# Patient Record
Sex: Female | Born: 1975 | Hispanic: Yes | Marital: Married | Smoking: Never smoker
Health system: Southern US, Community
[De-identification: ages and names within clinical notes are randomized; demographics above are authoritative.]

## PROBLEM LIST (undated history)

## (undated) ENCOUNTER — Inpatient Hospital Stay (HOSPITAL_COMMUNITY): Payer: Self-pay

## (undated) DIAGNOSIS — E162 Hypoglycemia, unspecified: Secondary | ICD-10-CM

## (undated) DIAGNOSIS — T7840XA Allergy, unspecified, initial encounter: Secondary | ICD-10-CM

## (undated) DIAGNOSIS — D649 Anemia, unspecified: Secondary | ICD-10-CM

## (undated) DIAGNOSIS — Z531 Procedure and treatment not carried out because of patient's decision for reasons of belief and group pressure: Secondary | ICD-10-CM

## (undated) DIAGNOSIS — IMO0001 Reserved for inherently not codable concepts without codable children: Secondary | ICD-10-CM

## (undated) DIAGNOSIS — N390 Urinary tract infection, site not specified: Secondary | ICD-10-CM

## (undated) DIAGNOSIS — K8061 Calculus of gallbladder and bile duct with cholecystitis, unspecified, with obstruction: Secondary | ICD-10-CM

## (undated) DIAGNOSIS — Z9889 Other specified postprocedural states: Secondary | ICD-10-CM

## (undated) DIAGNOSIS — Z9884 Bariatric surgery status: Secondary | ICD-10-CM

## (undated) DIAGNOSIS — M797 Fibromyalgia: Secondary | ICD-10-CM

## (undated) HISTORY — DX: Allergy, unspecified, initial encounter: T78.40XA

## (undated) HISTORY — PX: LASIK: SHX215

## (undated) HISTORY — DX: Anemia, unspecified: D64.9

## (undated) HISTORY — DX: Hypoglycemia, unspecified: E16.2

---

## 2002-04-19 DIAGNOSIS — Z9889 Other specified postprocedural states: Secondary | ICD-10-CM

## 2002-04-19 HISTORY — DX: Other specified postprocedural states: Z98.890

## 2010-04-19 DIAGNOSIS — Z9884 Bariatric surgery status: Secondary | ICD-10-CM

## 2010-04-19 HISTORY — DX: Bariatric surgery status: Z98.84

## 2010-05-09 HISTORY — PX: LAPAROSCOPIC GASTRIC BYPASS: SUR771

## 2011-07-21 ENCOUNTER — Telehealth (INDEPENDENT_AMBULATORY_CARE_PROVIDER_SITE_OTHER): Payer: Self-pay

## 2011-07-21 NOTE — Telephone Encounter (Signed)
Pt has travel plans at end of month and requests sooner appt to eval her GB. I advised pt that since her tests were done at [facility] we need to get those records for Dr Andrey Campanile to review. Pt can be reached a [phone].

## 2011-07-21 NOTE — Telephone Encounter (Signed)
Reminder placed if any cancellations open to move patient up.

## 2011-07-22 ENCOUNTER — Telehealth (INDEPENDENT_AMBULATORY_CARE_PROVIDER_SITE_OTHER): Payer: Self-pay | Admitting: General Surgery

## 2011-07-22 NOTE — Telephone Encounter (Signed)
Pt calling for Leslie Bray; informed she is off today.  She is calling to request appt with Dr. Andrey Campanile be moved up from 4/22, as she is travelling out of the country.

## 2011-07-23 NOTE — Telephone Encounter (Signed)
Already placed reminder if something cancels to move patient sooner, but if nothing opens up, there is nothing I can do.

## 2011-07-30 ENCOUNTER — Encounter (INDEPENDENT_AMBULATORY_CARE_PROVIDER_SITE_OTHER): Payer: Self-pay | Admitting: General Surgery

## 2011-07-30 ENCOUNTER — Ambulatory Visit (INDEPENDENT_AMBULATORY_CARE_PROVIDER_SITE_OTHER): Payer: Commercial Managed Care - PPO | Admitting: General Surgery

## 2011-07-30 VITALS — BP 108/70 | HR 64 | Temp 99.4°F | Resp 12 | Ht 66.0 in | Wt 148.4 lb

## 2011-07-30 DIAGNOSIS — Z9884 Bariatric surgery status: Secondary | ICD-10-CM | POA: Insufficient documentation

## 2011-07-30 DIAGNOSIS — K811 Chronic cholecystitis: Secondary | ICD-10-CM | POA: Insufficient documentation

## 2011-07-30 NOTE — Patient Instructions (Signed)
Try to obtain your operative note from [place].

## 2011-07-30 NOTE — Progress Notes (Signed)
Patient ID: Leslie Bray, female   DOB: [birth year], 36 y.o.   MRN: [id number]  Chief Complaint  Patient presents with  . Abdominal Pain    new pt- eval GB     HPI Leslie Bray is a 36 y.o. female.   HPI [name] is referred by Dr. Quentin Angst for evaluation for gallbladder removal. The patient underwent a laparoscopic Roux-en-Y gastric bypass in [place] January 2012. She has lost a total of 70 pounds since surgery. She states that she's been having abdominal problems for about one year. Initially it was very intermittent. It started as upper back pain in the midline and she described it as a mild discomfort. He would last for 2-3 days. She states that she was diagnosed with fibromyalgia. She states she had an ultrasound done last year which demonstrated reportedly sludge in her gallbladder. However over the past several months she has been having more frequent episodes of discomfort. She states that this is different than dumping. She has had one to 2 episodes of dumping syndrome and this is completely different. She now has episodes that occur every week that lasts for 2-3 days at a time. The discomfort starts in her epigastrium and on her left side and radiates to her back. She describes it as almost like a band across her upper abdomen. She denies any vomiting. She denies any fevers or chills. She denies any melena, hematochezia, constipation, diarrhea, acholic stools. She had a severe episode of chest discomfort this past Saturday which prompted her to go to the emergency room at [facility]. She states that an ultrasound and lab work was performed. She states that since there was no evidence of infection she was sent home. She denies taking any NSAID. She is not a smoker. She denies any jaundice. Past Medical History  Diagnosis Date  . Vitamin d deficiency   . Allergy   . Low blood sugar     Past Surgical History  Procedure Date  . Lasik 2004, 2010   bilateral  . Laparoscopic gastric bypass 05/09/10    [place]    Family History  Problem Relation Age of Onset  . Hyperlipidemia Mother     Social History History  Substance Use Topics  . Smoking status: Never Smoker   . Smokeless tobacco: Not on file  . Alcohol Use: Yes    No Known Allergies  Current Outpatient Prescriptions  Medication Sig Dispense Refill  . Cetirizine HCl (ZYRTEC PO) Take by mouth daily.      . ergocalciferol (VITAMIN D2) 50000 UNITS capsule Take 50,000 Units by mouth once a week.      . fluticasone (FLONASE) 50 MCG/ACT nasal spray Place 2 sprays into the nose as needed.      . Pregabalin (LYRICA PO) Take by mouth daily.        Review of Systems Review of Systems  Constitutional: Negative for fever, chills, activity change and appetite change.  HENT: Negative for congestion, neck pain and neck stiffness.   Eyes: Negative for photophobia and visual disturbance.  Respiratory: Negative for chest tightness and shortness of breath.   Cardiovascular: Negative for chest pain and palpitations.  Gastrointestinal:       See hpi  Genitourinary: Negative for dysuria, difficulty urinating and menstrual problem.  Musculoskeletal: Negative for joint swelling.  Skin: Negative for pallor, rash and wound.  Neurological: Negative for tremors, seizures, facial asymmetry, weakness and numbness.  Hematological: Negative for adenopathy. Does not bruise/bleed  easily.  Psychiatric/Behavioral: Negative for suicidal ideas and behavioral problems.    Blood pressure 108/70, pulse 64, temperature 99.4 F (37.4 C), temperature source Temporal, resp. rate 12, height 5\' 6"  (1.676 m), weight 148 lb 6.4 oz (67.314 kg).  Physical Exam Physical Exam  Vitals reviewed. Constitutional: She is oriented to person, place, and time. She appears well-developed and well-nourished. No distress.  HENT:  Head: Normocephalic and atraumatic.  Right Ear: External ear normal.  Left Ear: External  ear normal.  Nose: Nose normal.  Eyes: Conjunctivae are normal. No scleral icterus.  Neck: Normal range of motion. Neck supple. No tracheal deviation present. No thyromegaly present.  Cardiovascular: Normal rate, regular rhythm and normal heart sounds.   Pulmonary/Chest: Effort normal and breath sounds normal. No respiratory distress. She has no wheezes.  Abdominal: Soft. Bowel sounds are normal. She exhibits no distension. There is no tenderness. There is no rebound, no guarding and negative Murphy's sign.    Musculoskeletal: Normal range of motion. She exhibits no edema and no tenderness.  Lymphadenopathy:    She has no cervical adenopathy.  Neurological: She is alert and oriented to person, place, and time. She exhibits normal muscle tone.  Skin: Skin is warm and dry. No rash noted. She is not diaphoretic. No erythema.  Psychiatric: She has a normal mood and affect. Her behavior is normal. Thought content normal.    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 Less likely diagnoses include marginal ulcer, internal hernia.   We have requested her ER records from [facility].  She will attempt to get her operative note from [place] regarding her bypass surgery.    We discussed gallbladder disease. The patient was given Agricultural engineer. We discussed non-operative and operative management. I explained that after gastric bypass surgery and with rapid weight loss, it increases the risks of developing gallstones.   I discussed laparoscopic cholecystectomy with IOC in detail.  The patient was given educational material as well as diagrams  detailing the procedure.  We discussed the risks and benefits of a laparoscopic cholecystectomy including, but not limited to bleeding, infection, injury to surrounding structures such as the intestine or liver, bile leak, retained gallstones, need to convert to an open procedure, prolonged diarrhea, blood clots such as  DVT, common bile duct injury, anesthesia risks, failure to ameliorate her symptoms, and possible need for additional procedures.  We discussed the typical post-operative recovery course. I explained that the likelihood of improvement of their symptoms is 70-80%.  She has elected to proceed to the operating room. We will schedule her for surgery.   Mary Sella. Andrey Campanile, MD, FACS General, Bariatric, & Minimally Invasive Surgery [facility], [place]        [facility] M 07/30/2011, 6:01 PM

## 2011-08-05 ENCOUNTER — Telehealth (INDEPENDENT_AMBULATORY_CARE_PROVIDER_SITE_OTHER): Payer: Self-pay | Admitting: General Surgery

## 2011-08-05 NOTE — Telephone Encounter (Signed)
Message copied by Liliana Cline on Thu Aug 05, 2011  3:24 PM ------      Message from: [place], [place]      Created: Fri Jul 30, 2011  5:02 PM       Call and let her know if she has gallstones- [facility] records

## 2011-08-05 NOTE — Telephone Encounter (Signed)
Patient aware just received records from [facility]. No gallstones found, just sludge.

## 2011-08-09 ENCOUNTER — Ambulatory Visit (INDEPENDENT_AMBULATORY_CARE_PROVIDER_SITE_OTHER): Payer: Self-pay | Admitting: General Surgery

## 2011-08-11 ENCOUNTER — Other Ambulatory Visit (HOSPITAL_COMMUNITY): Payer: Self-pay

## 2011-08-18 DIAGNOSIS — K8061 Calculus of gallbladder and bile duct with cholecystitis, unspecified, with obstruction: Secondary | ICD-10-CM

## 2011-08-18 HISTORY — DX: Calculus of gallbladder and bile duct with cholecystitis, unspecified, with obstruction: K80.61

## 2011-08-23 ENCOUNTER — Encounter (INDEPENDENT_AMBULATORY_CARE_PROVIDER_SITE_OTHER): Payer: Self-pay

## 2011-09-03 ENCOUNTER — Encounter (HOSPITAL_COMMUNITY)
Admission: RE | Admit: 2011-09-03 | Discharge: 2011-09-03 | Disposition: A | Discharge status: Home / Self Care | Payer: Commercial Managed Care - PPO | Source: Ambulatory Visit | Attending: General Surgery | Admitting: General Surgery

## 2011-09-03 ENCOUNTER — Other Ambulatory Visit (HOSPITAL_COMMUNITY): Payer: Self-pay

## 2011-09-03 ENCOUNTER — Encounter (HOSPITAL_COMMUNITY): Payer: Self-pay | Admitting: Pharmacy Technician

## 2011-09-03 ENCOUNTER — Encounter (HOSPITAL_COMMUNITY): Payer: Self-pay

## 2011-09-03 HISTORY — DX: Fibromyalgia: M79.7

## 2011-09-03 HISTORY — DX: Urinary tract infection, site not specified: N39.0

## 2011-09-03 LAB — SURGICAL PCR SCREEN
MRSA, PCR: NEGATIVE
Staphylococcus aureus: NEGATIVE

## 2011-09-03 LAB — COMPREHENSIVE METABOLIC PANEL
ALT: 95 U/L — ABNORMAL HIGH (ref 0–35)
AST: 88 U/L — ABNORMAL HIGH (ref 0–37)
Albumin: 3.7 g/dL (ref 3.5–5.2)
Alkaline Phosphatase: 68 U/L (ref 39–117)
BUN: 10 mg/dL (ref 6–23)
CO2: 27 mEq/L (ref 19–32)
Calcium: 9.2 mg/dL (ref 8.4–10.5)
Chloride: 105 mEq/L (ref 96–112)
Creatinine, Ser: 0.72 mg/dL (ref 0.50–1.10)
GFR calc Af Amer: >90 mL/min (ref >90)
GFR calc non Af Amer: >90 mL/min (ref >90)
Glucose, Bld: 77 mg/dL (ref 70–99)
Potassium: 3.9 mEq/L (ref 3.5–5.1)
Sodium: 140 mEq/L (ref 135–145)
Total Bilirubin: 0.2 mg/dL — ABNORMAL LOW (ref 0.3–1.2)
Total Protein: 6.9 g/dL (ref 6.0–8.3)

## 2011-09-03 LAB — CBC
HCT: 35.1 % — ABNORMAL LOW (ref 36.0–46.0)
Hemoglobin: 11.6 g/dL — ABNORMAL LOW (ref 12.0–15.0)
MCH: 28.8 pg (ref 26.0–34.0)
MCHC: 33 g/dL (ref 30.0–36.0)
MCV: 87.1 fL (ref 78.0–100.0)
Platelets: 239 10*3/uL (ref 150–400)
RBC: 4.03 MIL/uL (ref 3.87–5.11)
RDW: 12.8 % (ref 11.5–15.5)
WBC: 6.5 10*3/uL (ref 4.0–10.5)

## 2011-09-03 LAB — HCG, SERUM, QUALITATIVE: Preg, Serum: NEGATIVE

## 2011-09-03 LAB — NO BLOOD PRODUCTS

## 2011-09-03 LAB — DIFFERENTIAL
Basophils Absolute: 0 10*3/uL (ref 0.0–0.1)
Basophils Relative: 1 % (ref 0–1)
Eosinophils Absolute: 0.3 10*3/uL (ref 0.0–0.7)
Eosinophils Relative: 4 % (ref 0–5)
Lymphocytes Relative: 31 % (ref 12–46)
Lymphs Abs: 2 10*3/uL (ref 0.7–4.0)
Monocytes Absolute: 0.7 10*3/uL (ref 0.1–1.0)
Monocytes Relative: 11 % (ref 3–12)
Neutro Abs: 3.5 10*3/uL (ref 1.7–7.7)
Neutrophils Relative %: 54 % (ref 43–77)

## 2011-09-03 MED ORDER — CHLORHEXIDINE GLUCONATE 4 % EX LIQD: 1.0000 "application " | Freq: Once | CUTANEOUS | Status: DC

## 2011-09-03 NOTE — Pre-Procedure Instructions (Addendum)
[address] Leslie Bray  09/03/2011   Your procedure is scheduled on: 09/10/11  Report to [facility] at 1130 AM.  Call this number if you have problems the morning of surgery: [phone]   Remember:   Do not eat food:After Midnight.  May have clear liquids: up to 4 Hours before arrival. 730 am  Clear liquids include soda, tea, black coffee, apple or grape juice, broth.  Take these medicines the morning of surgery with A SIP OF WATER: lyrica, zyrtec, flonase   Do not wear jewelry, make-up or nail polish.  Do not wear lotions, powders, or perfumes. You may wear deodorant.  Do not shave 48 hours prior to surgery. Men may shave face and neck.  Do not bring valuables to the hospital.  Contacts, dentures or bridgework may not be worn into surgery.  Leave suitcase in the car. After surgery it may be brought to your room.  For patients admitted to the hospital, checkout time is 11:00 AM the day of discharge.   Patients discharged the day of surgery will not be allowed to drive home.  Name and phone number of your driver:[name] [phone]  Special Instructions: CHG Shower Use Special Wash: 1/2 bottle night before surgery and 1/2 bottle morning of surgery.   Please read over the following fact sheets that you were given: Pain Booklet, Coughing and Deep Breathing, MRSA Information and Surgical Site Infection Prevention

## 2011-09-03 NOTE — Progress Notes (Signed)
Patient is jehovah's witness   No blood or blood products

## 2011-09-06 NOTE — Consult Note (Addendum)
Anesthesia Chart Review:  Patient is a 36 year old female scheduled for laparoscopic cholecystectomy on 09/10/11 for chronic cholecystitis.  By notes she had a recent (07/24/11) ultrasound that showed gallbladder sludge but otherwise WNL.  History includes Roux-en-Y gastric bypass in [place] in January 2012 with 70 lb weight loss since, Vit D deficiency, hypoglycemia, fibromyalgia, non-smoker.  Labs noted.  H/H 11.6/35.1.  She has refused all blood products (Jehovah's Witness).  AST/ALT are elevated at 88/95.  This may be related to her cholecystitis.  Currently there are no comparison labs.  She was seen at [facility] in April 2013.  I have requested that they send comparison labs if available.  Will follow-up when additional records are received.  Addendum:  09/07/11 1045  I have yet to receive records from [facility], but was able to call her PCP (Dr. Quentin Angst) office at [facility].  They had a CMET from 07/15/11 that showed an AST of 43 and AST of 44.  Since she has had further elevation in her transaminases I will repeat HFP and get a PT/PTT on the day of surgery to ensure stability.  Shonna Chock, PA-C

## 2011-09-09 MED ORDER — DEXTROSE 5 % IV SOLN
2.0000 g | INTRAVENOUS | Status: AC
  Administered 2011-09-10: 2 g via INTRAVENOUS
  Filled 2011-09-09: qty 2

## 2011-09-10 ENCOUNTER — Ambulatory Visit (HOSPITAL_COMMUNITY): Payer: Commercial Managed Care - PPO

## 2011-09-10 ENCOUNTER — Encounter (HOSPITAL_COMMUNITY)
Admission: RE | Disposition: A | Discharge status: Home / Self Care | Payer: Self-pay | Source: Ambulatory Visit | Attending: General Surgery

## 2011-09-10 ENCOUNTER — Encounter (HOSPITAL_COMMUNITY): Payer: Self-pay | Admitting: *Deleted

## 2011-09-10 ENCOUNTER — Encounter (HOSPITAL_COMMUNITY): Payer: Self-pay | Admitting: Vascular Surgery

## 2011-09-10 ENCOUNTER — Encounter (HOSPITAL_COMMUNITY): Payer: Self-pay | Admitting: General Practice

## 2011-09-10 ENCOUNTER — Ambulatory Visit (HOSPITAL_COMMUNITY): Payer: Commercial Managed Care - PPO | Admitting: Vascular Surgery

## 2011-09-10 ENCOUNTER — Observation Stay (HOSPITAL_COMMUNITY)
Admission: RE | Admit: 2011-09-10 | Discharge: 2011-09-11 | Disposition: A | Discharge status: Home / Self Care | Payer: Commercial Managed Care - PPO | Source: Ambulatory Visit | Attending: General Surgery | Admitting: General Surgery

## 2011-09-10 DIAGNOSIS — Z01812 Encounter for preprocedural laboratory examination: Secondary | ICD-10-CM | POA: Insufficient documentation

## 2011-09-10 DIAGNOSIS — IMO0001 Reserved for inherently not codable concepts without codable children: Secondary | ICD-10-CM | POA: Insufficient documentation

## 2011-09-10 DIAGNOSIS — K811 Chronic cholecystitis: Secondary | ICD-10-CM

## 2011-09-10 DIAGNOSIS — K8010 Calculus of gallbladder with chronic cholecystitis without obstruction: Secondary | ICD-10-CM

## 2011-09-10 DIAGNOSIS — Z531 Procedure and treatment not carried out because of patient's decision for reasons of belief and group pressure: Secondary | ICD-10-CM

## 2011-09-10 HISTORY — PX: CHOLECYSTECTOMY: SHX55

## 2011-09-10 HISTORY — DX: Procedure and treatment not carried out because of patient's decision for reasons of belief and group pressure: Z53.1

## 2011-09-10 HISTORY — DX: Reserved for inherently not codable concepts without codable children: IMO0001

## 2011-09-10 LAB — HEPATIC FUNCTION PANEL
ALT: 74 U/L — ABNORMAL HIGH (ref 0–35)
AST: 57 U/L — ABNORMAL HIGH (ref 0–37)
Albumin: 4.2 g/dL (ref 3.5–5.2)
Alkaline Phosphatase: 66 U/L (ref 39–117)
Bilirubin, Direct: <0.1 mg/dL (ref 0.0–0.3)
Total Bilirubin: 0.4 mg/dL (ref 0.3–1.2)
Total Protein: 7.5 g/dL (ref 6.0–8.3)

## 2011-09-10 LAB — APTT: aPTT: 30 seconds (ref 24–37)

## 2011-09-10 LAB — PROTIME-INR
INR: 1.07 (ref 0.00–1.49)
Prothrombin Time: 14.1 seconds (ref 11.6–15.2)

## 2011-09-10 SURGERY — LAPAROSCOPIC CHOLECYSTECTOMY WITH INTRAOPERATIVE CHOLANGIOGRAM
Anesthesia: General | Site: Abdomen | Wound class: Clean Contaminated

## 2011-09-10 MED ORDER — LACTATED RINGERS IV SOLN: INTRAVENOUS | Status: DC

## 2011-09-10 MED ORDER — ROCURONIUM BROMIDE 100 MG/10ML IV SOLN
INTRAVENOUS | Status: DC | PRN
  Administered 2011-09-10: 40 mg via INTRAVENOUS
  Administered 2011-09-10: 20 mg via INTRAVENOUS

## 2011-09-10 MED ORDER — BUPIVACAINE-EPINEPHRINE 0.25% -1:200000 IJ SOLN
INTRAMUSCULAR | Status: DC | PRN
  Administered 2011-09-10: 11 mL

## 2011-09-10 MED ORDER — PROPOFOL 10 MG/ML IV EMUL
INTRAVENOUS | Status: DC | PRN
  Administered 2011-09-10: 150 mg via INTRAVENOUS

## 2011-09-10 MED ORDER — FENTANYL CITRATE 0.05 MG/ML IJ SOLN
INTRAMUSCULAR | Status: DC | PRN
  Administered 2011-09-10: 100 ug via INTRAVENOUS
  Administered 2011-09-10: 50 ug via INTRAVENOUS
  Administered 2011-09-10: 100 ug via INTRAVENOUS
  Administered 2011-09-10 (×2): 50 ug via INTRAVENOUS

## 2011-09-10 MED ORDER — MORPHINE SULFATE 2 MG/ML IJ SOLN: 1.0000 mg | INTRAMUSCULAR | Status: DC | PRN

## 2011-09-10 MED ORDER — ADULT MULTIVITAMIN W/MINERALS CH
1.0000 | ORAL_TABLET | Freq: Every day | ORAL | Status: DC
  Administered 2011-09-10: 1 via ORAL
  Filled 2011-09-10 (×2): qty 1

## 2011-09-10 MED ORDER — OXYCODONE-ACETAMINOPHEN 5-325 MG PO TABS
1.0000 | ORAL_TABLET | ORAL | Status: DC | PRN
  Administered 2011-09-10 – 2011-09-11 (×2): 2 via ORAL
  Filled 2011-09-10 (×2): qty 2

## 2011-09-10 MED ORDER — SODIUM CHLORIDE 0.9 % IV SOLN
INTRAVENOUS | Status: DC | PRN
  Administered 2011-09-10: 14:00:00

## 2011-09-10 MED ORDER — OXYCODONE-ACETAMINOPHEN 5-325 MG PO TABS: 1.0000 | ORAL_TABLET | ORAL | Status: AC | PRN

## 2011-09-10 MED ORDER — 0.9 % SODIUM CHLORIDE (POUR BTL) OPTIME
TOPICAL | Status: DC | PRN
  Administered 2011-09-10: 1000 mL

## 2011-09-10 MED ORDER — SODIUM CHLORIDE 0.9 % IR SOLN
Status: DC | PRN
  Administered 2011-09-10: 1000 mL

## 2011-09-10 MED ORDER — LIDOCAINE HCL (CARDIAC) 20 MG/ML IV SOLN
INTRAVENOUS | Status: DC | PRN
  Administered 2011-09-10: 60 mg via INTRAVENOUS

## 2011-09-10 MED ORDER — MIDAZOLAM HCL 5 MG/5ML IJ SOLN
INTRAMUSCULAR | Status: DC | PRN
  Administered 2011-09-10: 2 mg via INTRAVENOUS

## 2011-09-10 MED ORDER — ONDANSETRON HCL 4 MG/2ML IJ SOLN
INTRAMUSCULAR | Status: DC | PRN
  Administered 2011-09-10: 4 mg via INTRAVENOUS

## 2011-09-10 MED ORDER — ENOXAPARIN SODIUM 40 MG/0.4ML ~~LOC~~ SOLN
40.0000 mg | SUBCUTANEOUS | Status: DC
  Administered 2011-09-11: 40 mg via SUBCUTANEOUS
  Filled 2011-09-10 (×2): qty 0.4

## 2011-09-10 MED ORDER — KCL IN DEXTROSE-NACL 20-5-0.45 MEQ/L-%-% IV SOLN
INTRAVENOUS | Status: DC
  Administered 2011-09-10 – 2011-09-11 (×2): via INTRAVENOUS
  Filled 2011-09-10 (×3): qty 1000

## 2011-09-10 MED ORDER — HYDROMORPHONE HCL PF 1 MG/ML IJ SOLN
0.2500 mg | INTRAMUSCULAR | Status: DC | PRN
  Administered 2011-09-10 (×2): 0.5 mg via INTRAVENOUS

## 2011-09-10 MED ORDER — ONDANSETRON HCL 4 MG/2ML IJ SOLN: 4.0000 mg | Freq: Four times a day (QID) | INTRAMUSCULAR | Status: DC | PRN

## 2011-09-10 MED ORDER — ONDANSETRON HCL 4 MG PO TABS: 4.0000 mg | ORAL_TABLET | Freq: Four times a day (QID) | ORAL | Status: DC | PRN

## 2011-09-10 MED ORDER — NEOSTIGMINE METHYLSULFATE 1 MG/ML IJ SOLN
INTRAMUSCULAR | Status: DC | PRN
  Administered 2011-09-10: 3 mg via INTRAVENOUS

## 2011-09-10 MED ORDER — GLYCOPYRROLATE 0.2 MG/ML IJ SOLN
INTRAMUSCULAR | Status: DC | PRN
  Administered 2011-09-10: .5 mg via INTRAVENOUS

## 2011-09-10 MED ORDER — PREGABALIN 50 MG PO CAPS
75.0000 mg | ORAL_CAPSULE | Freq: Every day | ORAL | Status: DC
  Filled 2011-09-10: qty 3

## 2011-09-10 MED ORDER — LACTATED RINGERS IV SOLN
INTRAVENOUS | Status: DC | PRN
  Administered 2011-09-10: 13:00:00 via INTRAVENOUS

## 2011-09-10 MED ORDER — CYCLOBENZAPRINE HCL 10 MG PO TABS: 10.0000 mg | ORAL_TABLET | Freq: Three times a day (TID) | ORAL | Status: DC | PRN

## 2011-09-10 MED ORDER — DROPERIDOL 2.5 MG/ML IJ SOLN: 0.6250 mg | INTRAMUSCULAR | Status: DC | PRN

## 2011-09-10 SURGICAL SUPPLY — 51 items
APPLIER CLIP 5 13 M/L LIGAMAX5 (MISCELLANEOUS) ×2
BANDAGE ADHESIVE 1X3 (GAUZE/BANDAGES/DRESSINGS) ×6 IMPLANT
BENZOIN TINCTURE PRP APPL 2/3 (GAUZE/BANDAGES/DRESSINGS) ×2 IMPLANT
BLADE SURG ROTATE 9660 (MISCELLANEOUS) ×2 IMPLANT
CANISTER SUCTION 2500CC (MISCELLANEOUS) ×2 IMPLANT
CHLORAPREP W/TINT 26ML (MISCELLANEOUS) ×2 IMPLANT
CLIP APPLIE 5 13 M/L LIGAMAX5 (MISCELLANEOUS) ×1 IMPLANT
CLOTH BEACON ORANGE TIMEOUT ST (SAFETY) ×2 IMPLANT
COVER MAYO STAND STRL (DRAPES) ×2 IMPLANT
COVER SURGICAL LIGHT HANDLE (MISCELLANEOUS) ×2 IMPLANT
DECANTER SPIKE VIAL GLASS SM (MISCELLANEOUS) IMPLANT
DRAPE C-ARM 42X72 X-RAY (DRAPES) ×2 IMPLANT
DRAPE CAMERA CLOSED 9X96 (DRAPES) ×2 IMPLANT
DRAPE UTILITY 15X26 W/TAPE STR (DRAPE) ×4 IMPLANT
DRSG TEGADERM 4X4.75 (GAUZE/BANDAGES/DRESSINGS) ×2 IMPLANT
ELECT REM PT RETURN 9FT ADLT (ELECTROSURGICAL) ×2
ELECTRODE REM PT RTRN 9FT ADLT (ELECTROSURGICAL) ×1 IMPLANT
ENDOLOOP SUT PDS II  0 18 (SUTURE) ×1
ENDOLOOP SUT PDS II 0 18 (SUTURE) ×1 IMPLANT
GAUZE SPONGE 2X2 8PLY STRL LF (GAUZE/BANDAGES/DRESSINGS) ×1 IMPLANT
GLOVE BIOGEL M STRL SZ7.5 (GLOVE) ×2 IMPLANT
GLOVE BIOGEL PI IND STRL 6 (GLOVE) ×1 IMPLANT
GLOVE BIOGEL PI IND STRL 6.5 (GLOVE) ×1 IMPLANT
GLOVE BIOGEL PI IND STRL 7.0 (GLOVE) ×1 IMPLANT
GLOVE BIOGEL PI IND STRL 8 (GLOVE) ×1 IMPLANT
GLOVE BIOGEL PI INDICATOR 6 (GLOVE) ×1
GLOVE BIOGEL PI INDICATOR 6.5 (GLOVE) ×1
GLOVE BIOGEL PI INDICATOR 7.0 (GLOVE) ×1
GLOVE BIOGEL PI INDICATOR 8 (GLOVE) ×1
GLOVE ECLIPSE 6.5 STRL STRAW (GLOVE) ×2 IMPLANT
GOWN STRL NON-REIN LRG LVL3 (GOWN DISPOSABLE) IMPLANT
GOWN STRL REIN XL XLG (GOWN DISPOSABLE) IMPLANT
KIT BASIN OR (CUSTOM PROCEDURE TRAY) ×2 IMPLANT
KIT ROOM TURNOVER OR (KITS) ×2 IMPLANT
NS IRRIG 1000ML POUR BTL (IV SOLUTION) ×2 IMPLANT
PAD ARMBOARD 7.5X6 YLW CONV (MISCELLANEOUS) ×4 IMPLANT
POUCH SPECIMEN RETRIEVAL 10MM (ENDOMECHANICALS) ×2 IMPLANT
SCISSORS LAP 5X35 DISP (ENDOMECHANICALS) IMPLANT
SET CHOLANGIOGRAPH 5 50 .035 (SET/KITS/TRAYS/PACK) ×2 IMPLANT
SET IRRIG TUBING LAPAROSCOPIC (IRRIGATION / IRRIGATOR) ×2 IMPLANT
SLEEVE ENDOPATH XCEL 5M (ENDOMECHANICALS) ×4 IMPLANT
SPECIMEN JAR SMALL (MISCELLANEOUS) ×2 IMPLANT
SPONGE GAUZE 2X2 STER 10/PKG (GAUZE/BANDAGES/DRESSINGS) ×1
SUT MNCRL AB 4-0 PS2 18 (SUTURE) ×2 IMPLANT
SUT VICRYL 0 UR6 27IN ABS (SUTURE) ×2 IMPLANT
TOWEL OR 17X24 6PK STRL BLUE (TOWEL DISPOSABLE) IMPLANT
TOWEL OR 17X26 10 PK STRL BLUE (TOWEL DISPOSABLE) IMPLANT
TRAY LAPAROSCOPIC (CUSTOM PROCEDURE TRAY) ×2 IMPLANT
TROCAR XCEL BLUNT TIP 100MML (ENDOMECHANICALS) ×2 IMPLANT
TROCAR XCEL NON-BLD 5MMX100MML (ENDOMECHANICALS) ×2 IMPLANT
WATER STERILE IRR 1000ML POUR (IV SOLUTION) IMPLANT

## 2011-09-10 NOTE — Discharge Instructions (Signed)
[facility] LAPAROSCOPIC SURGERY: POST OP INSTRUCTIONS Always review your discharge instruction sheet given to you by the facility where your surgery was performed. IF YOU HAVE DISABILITY OR FAMILY LEAVE FORMS, YOU MUST BRING THEM TO THE OFFICE FOR PROCESSING.   DO NOT GIVE THEM TO YOUR DOCTOR.  1. A prescription for pain medication may be given to you upon discharge.  Take your pain medication as prescribed, if needed.  If narcotic pain medicine is not needed, then you may take acetaminophen (Tylenol) or ibuprofen (Advil) as needed. 2. Take your usually prescribed medications unless otherwise directed. 3. If you need a refill on your pain medication, please contact your pharmacy.  They will contact our office to request authorization. Prescriptions will not be filled after 5pm or on week-ends. 4. You should follow a light diet the first few days after arrival home, such as soup and crackers, etc.  Be sure to include lots of fluids daily. 5. Most patients will experience some swelling and bruising in the area of the incisions.  Ice packs will help.  Swelling and bruising can take several days to resolve.  6. It is common to experience some constipation if taking pain medication after surgery.  Increasing fluid intake and taking a stool softener (such as Colace) will usually help or prevent this problem from occurring.  A mild laxative (Milk of Magnesia or Miralax) should be taken according to package instructions if there are no bowel movements after 48 hours. 7. Unless discharge instructions indicate otherwise, you may remove your bandages 24 hours after surgery, and you may shower at that time.    If your surgeon used skin glue on the incision, you may shower in 24 hours.  The glue will flake off over the next 2-3 weeks.  Any sutures or staples will be removed at the office during your follow-up visit. 8. ACTIVITIES:  You may resume regular (light) daily activities beginning the  next day--such as daily self-care, walking, climbing stairs--gradually increasing activities as tolerated.  You may have sexual intercourse when it is comfortable.  Refrain from any heavy lifting or straining until approved by your doctor. a. You may drive when you are no longer taking prescription pain medication, you can comfortably wear a seatbelt, and you can safely maneuver your car and apply brakes. 9. You should see your doctor in the office for a follow-up appointment approximately 2-3 weeks after your surgery.  Make sure that you call for this appointment within a day or two after you arrive home to insure a convenient appointment time. 10. OTHER INSTRUCTIONS:  WHEN TO CALL YOUR DOCTOR: 1. Fever over 101.0 2. Inability to urinate 3. Continued bleeding from incision. 4. Increased pain, redness, or drainage from the incision. 5. Increasing abdominal pain  The clinic staff is available to answer your questions during regular business hours.  Please don't hesitate to call and ask to speak to one of the nurses for clinical concerns.  If you have a medical emergency, go to the nearest emergency room or call 911.  A surgeon from [facility] is always on call at the hospital. [address], [place], [place]  [address] ? [address] [address], [place], [place]   [address] [phone] ? [phone] ? FAX [phone] Web site: [email or web address]

## 2011-09-10 NOTE — Anesthesia Postprocedure Evaluation (Signed)
  Anesthesia Post-op Note  Patient: Leslie Bray  Procedure(s) Performed: Procedure(s) (LRB): LAPAROSCOPIC CHOLECYSTECTOMY WITH INTRAOPERATIVE CHOLANGIOGRAM (N/A)  Patient Location: PACU  Anesthesia Type: General  Level of Consciousness: awake  Airway and Oxygen Therapy: Patient Spontanous Breathing and Patient connected to nasal cannula oxygen  Post-op Pain: mild  Post-op Assessment: Post-op Vital signs reviewed  Post-op Vital Signs: Reviewed  Complications: No apparent anesthesia complications

## 2011-09-10 NOTE — Anesthesia Preprocedure Evaluation (Addendum)
Anesthesia Evaluation  Patient identified by MRN, date of birth, ID band Patient awake    Reviewed: Allergy & Precautions, H&P , NPO status , Patient's Chart, lab work & pertinent test results  History of Anesthesia Complications Negative for: history of anesthetic complications  Airway Mallampati: II TM Distance: >3 FB Neck ROM: Full    Dental  (+) Teeth Intact and Dental Advisory Given   Pulmonary neg pulmonary ROS,  breath sounds clear to auscultation  Pulmonary exam normal       Cardiovascular negative cardio ROS  Rhythm:Regular Rate:Normal     Neuro/Psych Fibromyalgia  Neuromuscular disease negative psych ROS   GI/Hepatic negative GI ROS, Neg liver ROS,   Endo/Other  negative endocrine ROS  Renal/GU negative Renal ROS     Musculoskeletal  (+) Fibromyalgia -  Abdominal   Peds  Hematology   Anesthesia Other Findings   Reproductive/Obstetrics                           Anesthesia Physical Anesthesia Plan  ASA: II  Anesthesia Plan: General   Post-op Pain Management:    Induction: Intravenous  Airway Management Planned: Oral ETT  Additional Equipment:   Intra-op Plan:   Post-operative Plan: Extubation in OR  Informed Consent: I have reviewed the patients History and Physical, chart, labs and discussed the procedure including the risks, benefits and alternatives for the proposed anesthesia with the patient or authorized representative who has indicated his/her understanding and acceptance.   Dental advisory given  Plan Discussed with: CRNA, Anesthesiologist and Surgeon  Anesthesia Plan Comments:         Anesthesia Quick Evaluation

## 2011-09-10 NOTE — H&P (Signed)
Leslie Bray is a 36 y.o. female.  HPI  37 year old Hispanic female is referred by Dr. Quentin Angst for evaluation for gallbladder removal. The patient underwent a laparoscopic Roux-en-Y gastric bypass in [place] January 2012. She has lost a total of 70 pounds since surgery. She states that she's been having abdominal problems for about one year. Initially it was very intermittent. It started as upper back pain in the midline and she described it as a mild discomfort. He would last for 2-3 days. She states that she was diagnosed with fibromyalgia. She states she had an ultrasound done last year which demonstrated reportedly sludge in her gallbladder. However over the past several months she has been having more frequent episodes of discomfort. She states that this is different than dumping. She has had one to 2 episodes of dumping syndrome and this is completely different. She now has episodes that occur every week that lasts for 2-3 days at a time. The discomfort starts in her epigastrium and on her left side and radiates to her back. She describes it as almost like a band across her upper abdomen. She denies any vomiting. She denies any fevers or chills. She denies any melena, hematochezia, constipation, diarrhea, acholic stools. She had a severe episode of chest discomfort this past Saturday which prompted her to go to the emergency room at [facility]. She states that an ultrasound and lab work was performed. She states that since there was no evidence of infection she was sent home. She denies taking any NSAID. She is not a smoker. She denies any jaundice.   Since she was last seen she reports ongoing typical symptoms. She denies f/c/cp/sob/ED trips/new meds/allergies  PMHx, PSHx, SOCHx, FAMHx, ALL reviewed and unchanged  Past Medical History   Diagnosis  Date   .  Vitamin d deficiency    .  Allergy    .  Low blood sugar     Past Surgical History   Procedure  Date   .  Lasik  2004, 2010     bilateral   .  Laparoscopic gastric bypass  05/09/10     [place]    Family History   Problem  Relation  Age of Onset   .  Hyperlipidemia  Mother     Social History  History   Substance Use Topics   .  Smoking status:  Never Smoker   .  Smokeless tobacco:  Not on file   .  Alcohol Use:  Yes    No Known Allergies  Current Outpatient Prescriptions   Medication  Sig  Dispense  Refill   .  Cetirizine HCl (ZYRTEC PO)  Take by mouth daily.     .  ergocalciferol (VITAMIN D2) 50000 UNITS capsule  Take 50,000 Units by mouth once a week.     .  fluticasone (FLONASE) 50 MCG/ACT nasal spray  Place 2 sprays into the nose as needed.     .  Pregabalin (LYRICA PO)  Take by mouth daily.      Review of Systems  Review of Systems  Constitutional: Negative for fever, chills, activity change and appetite change.  HENT: Negative for congestion, neck pain and neck stiffness.  Eyes: Negative for photophobia and visual disturbance.  Respiratory: Negative for chest tightness and shortness of breath.  Cardiovascular: Negative for chest pain and palpitations.  Gastrointestinal:  See hpi  Genitourinary: Negative for dysuria, difficulty urinating and menstrual problem.  Musculoskeletal: Negative for joint swelling.  Skin: Negative  for pallor, rash and wound.  Neurological: Negative for tremors, seizures, facial asymmetry, weakness and numbness.  Hematological: Negative for adenopathy. Does not bruise/bleed easily.  Psychiatric/Behavioral: Negative for suicidal ideas and behavioral problems.   Physical Exam  Physical Exam  Vitals reviewed.  BP 103/67  Pulse 78  Temp(Src) 98 F (36.7 C) (Oral)  Resp 20  SpO2 97%  LMP 08/25/2011  Constitutional: She is oriented to person, place, and time. She appears well-developed and well-nourished. No distress.  HENT:  Head: Normocephalic and atraumatic.  Right Ear: External ear normal.  Left Ear: External ear normal.  Nose: Nose normal.  Eyes: Conjunctivae  are normal. No scleral icterus.  Neck: Normal range of motion. Neck supple.  Cardiovascular: Normal rate, regular rhythm and normal heart sounds.  Pulmonary/Chest: Effort normal and breath sounds normal. No respiratory distress.  Abdominal: Soft. Bowel sounds are normal. She exhibits no distension. There is no tenderness. There is no rebound, no guarding and negative Murphy's sign. Multiple healed trocar sites Musculoskeletal: Normal range of motion. She exhibits no edema and no tenderness.  Neurological: She is alert and oriented to person, place, and time. She exhibits normal muscle tone.  Skin: Skin is warm and dry. No rash noted. She is not diaphoretic. No erythema.  Psychiatric: She has a normal mood and affect. Her behavior is normal. Thought content normal.   Data Reviewed  Labs from 07/15/11 - nml cbc, nml cmet except for AST 44, normal lipase, amylase; vit D low at 28  HIDA results 07/19/11 - normal uptake and excretion by liver, gallbladder fill; EF 24%   preop labs reviewed - AST and ALT slightly elevated.  Assessment   Biliary dyskinesia, likely chronic cholecystitis  H/o Laparoscopic Roux-en-y gastric bypass   Plan   I think her symptoms are consistent will gallbladder disease. Less likely diagnoses include marginal ulcer, internal hernia.  We have requested her ER records from [facility].  She will attempt to get her operative note from [place] regarding her bypass surgery.  We discussed gallbladder disease. The patient was given Agricultural engineer. We discussed non-operative and operative management. I explained that after gastric bypass surgery and with rapid weight loss, it increases the risks of developing gallstones.  I discussed laparoscopic cholecystectomy with IOC in detail. The patient was given educational material as well as diagrams detailing the procedure. We discussed the risks and benefits of a laparoscopic cholecystectomy including, but not  limited to bleeding, infection, injury to surrounding structures such as the intestine or liver, bile leak, retained gallstones, need to convert to an open procedure, prolonged diarrhea, blood clots such as DVT, common bile duct injury, anesthesia risks, failure to ameliorate her symptoms, and possible need for additional procedures. We discussed the typical post-operative recovery course. I explained that the likelihood of improvement of their symptoms is 70-80%.  She has elected to proceed to the operating room. We will schedule her for surgery.   To OR today. All questions asked and answered.   Mary Sella. Andrey Campanile, MD, FACS General, Bariatric, & Minimally Invasive Surgery [facility], [place]

## 2011-09-10 NOTE — Transfer of Care (Signed)
Immediate Anesthesia Transfer of Care Note  Patient: Leslie Bray  Procedure(s) Performed: Procedure(s) (LRB): LAPAROSCOPIC CHOLECYSTECTOMY WITH INTRAOPERATIVE CHOLANGIOGRAM (N/A)  Patient Location: PACU  Anesthesia Type: General  Level of Consciousness: awake, alert , oriented and patient cooperative  Airway & Oxygen Therapy: Patient Spontanous Breathing and Patient connected to nasal cannula oxygen  Post-op Assessment: Report given to PACU RN, Post -op Vital signs reviewed and stable and Patient moving all extremities X 4  Post vital signs: Reviewed and stable  Complications: No apparent anesthesia complications

## 2011-09-10 NOTE — Op Note (Signed)
Laparoscopic Cholecystectomy with IOC Procedure Note  Indications: This patient presents with symptomatic gallbladder disease and will undergo laparoscopic cholecystectomy.  Pre-operative Diagnosis: Calculus of gallbladder with other cholecystitis, without mention of obstruction  Post-operative Diagnosis: Same  Surgeon: Atilano Ina   Assistants: Rosenbower  Anesthesia: General endotracheal anesthesia  ASA Class: 2  Procedure Details  The patient was seen again in the Holding Room. The risks, benefits, complications, treatment options, and expected outcomes were discussed with the patient. The possibilities of reaction to medication, pulmonary aspiration, perforation of viscus, bleeding, recurrent infection, finding a normal gallbladder, the need for additional procedures, failure to diagnose a condition, the possible need to convert to an open procedure, and creating a complication requiring transfusion or operation were discussed with the patient. The likelihood of improving the patient's symptoms with return to their baseline status is good.  The patient and/or family concurred with the proposed plan, giving informed consent. The site of surgery properly noted. The patient was taken to Operating Room, identified as Bernadette Hoit DE LOSSANTOS and the procedure verified as Laparoscopic Cholecystectomy with Intraoperative Cholangiogram. A Time Out was held and the above information confirmed.  Prior to the induction of general anesthesia, antibiotic prophylaxis was administered. General endotracheal anesthesia was then administered and tolerated well. After the induction, the abdomen was prepped with Chloraprep and draped in the sterile fashion. The patient was positioned in the supine position.  Local anesthetic agent was injected into the skin near the umbilicus and an incision made. We dissected down to the abdominal fascia with blunt dissection.  The fascia was incised vertically and we entered  the peritoneal cavity bluntly.  A pursestring suture of 0-Vicryl was placed around the fascial opening.  The Hasson cannula was inserted and secured with the stay suture.  Pneumoperitoneum was then created with CO2 and tolerated well without any adverse changes in the patient's vital signs. An 5-mm port was placed in the subxiphoid position.  Two 5-mm ports were placed in the right upper quadrant. All skin incisions were infiltrated with a local anesthetic agent before making the incision and placing the trocars.   We positioned the patient in reverse Trendelenburg, tilted slightly to the patient's left.  The gallbladder was identified, the fundus grasped and retracted cephalad. Adhesions were lysed bluntly and with the electrocautery where indicated, taking care not to injure any adjacent organs or viscus. The infundibulum was grasped and retracted laterally, exposing the peritoneum overlying the triangle of Calot. This was then divided and exposed in a blunt fashion. A critical view of the cystic duct and cystic artery was obtained.  The cystic artery did cross anteriorly to the cystic duct. The cystic duct was clearly identified and bluntly dissected circumferentially. I mobilized the gallbladder both medially and laterally. There were no other structures entering the gallbladder. Since the cystic artery was anterior to the cystic duct, I divided the cystic artery in order to get to the cystic duct. The cystic artery was divided with clips.  The cystic duct was ligated with a clip distally.   An incision was made in the cystic duct and the [facility] cholangiogram catheter introduced. The catheter was secured using a clip. A cholangiogram was then obtained which showed good visualization of the distal and proximal biliary tree with no sign of filling defects or obstruction.  Contrast flowed easily into the duodenum. The catheter was then removed.   The cystic duct was then ligated with 1 clip and PDS endoloop and  divided.  The gallbladder was dissected from the liver bed in retrograde fashion with the electrocautery. The gallbladder was removed and placed in an Endocatch sac.  The gallbladder and Endocatch sac were then removed through the umbilical port site. The liver bed was irrigated and inspected. Hemostasis was achieved with the electrocautery. Copious irrigation was utilized and was repeatedly aspirated until clear.      We again inspected the right upper quadrant for hemostasis.  The pursestring suture was used to close the umbilical fascia. The umbilical closure was inspected and there was an air leak and nothing trapped within the closure.An additional figure of eight 0-vicryl was placed. There was no air leak at this point.  Pneumoperitoneum was released as we removed the trocars.  4-0 Monocryl was used to close the skin.  Dermabond was applied. The patient was then extubated and brought to the recovery room in stable condition. Instrument, sponge, and needle counts were correct at closure and at the conclusion of the case.   Findings: Chronic Cholecystitis with Cholelithiasis  Estimated Blood Loss: Minimal         Drains: none         Specimens: Gallbladder           Complications: None; patient tolerated the procedure well.         Disposition: PACU - hemodynamically stable.         Condition: stable  Leslie Bray. Andrey Campanile, MD, FACS General, Bariatric, & Minimally Invasive Surgery [facility], [place]

## 2011-09-11 MED ORDER — OXYCODONE-ACETAMINOPHEN 5-325 MG PO TABS: 1.0000 | ORAL_TABLET | ORAL | Status: AC | PRN

## 2011-09-11 NOTE — Progress Notes (Signed)
1 Day Post-Op  Subjective: Post-op nausea resolved.  Pain controlled with Percocet.  Wants to go home  Objective: Vital signs in last 24 hours: Temp:  [97 F (36.1 C)-99.6 F (37.6 C)] 98.4 F (36.9 C) (05/25 0528) Pulse Rate:  [48-78] 62  (05/25 0528) Resp:  [15-21] 18  (05/25 0528) BP: (95-118)/(51-67) 95/51 mmHg (05/25 0528) SpO2:  [97 %-100 %] 97 % (05/25 0528) Weight:  [152 lb 1.9 oz (69 kg)] 152 lb 1.9 oz (69 kg) (05/25 0900) Last BM Date: 09/09/11  Intake/Output from previous day: 05/24 0701 - 05/25 0700 In: 2066.8 [I.V.:2066.8] Out: 1000 [Urine:1000] Intake/Output this shift:    GI: soft, minimal tenderness C/o right shoulder pain, as would be expected Incisions - c/d/i  Lab Results:  No results found for this basename: WBC:2,HGB:2,HCT:2,PLT:2 in the last 72 hours BMET No results found for this basename: NA:2,K:2,CL:2,CO2:2,GLUCOSE:2,BUN:2,CREATININE:2,CALCIUM:2 in the last 72 hours PT/INR  Basename 09/10/11 1654  LABPROT 14.1  INR 1.07   ABG No results found for this basename: PHART:2,PCO2:2,PO2:2,HCO3:2 in the last 72 hours  Studies/Results: Dg Cholangiogram Operative  09/10/2011  *RADIOLOGY REPORT*  Clinical Data:   Chronic cholecystitis.  INTRAOPERATIVE CHOLANGIOGRAM  Technique:  Cholangiographic images from the C-arm fluoroscopic device were submitted for interpretation post-operatively.  Please see the procedural report for the amount of contrast and the fluoroscopy time utilized.  Comparison:  None.  Findings:  The biliary tree is normal with free flow of contrast from the cystic duct into the biliary tree and into the duodenum.  IMPRESSION: Normal intraoperative cholangiogram.  Original Report Authenticated By: Gwynn Burly, M.D.    Anti-infectives: Anti-infectives     Start     Dose/Rate Route Frequency Ordered Stop   09/09/11 1202   cefOXitin (MEFOXIN) 2 g in dextrose 5 % 50 mL IVPB        2 g 100 mL/hr over 30 Minutes Intravenous 60 min  pre-op 09/09/11 1202 09/10/11 1335          Assessment/Plan: s/p Procedure(s) (LRB): LAPAROSCOPIC CHOLECYSTECTOMY WITH INTRAOPERATIVE CHOLANGIOGRAM (N/A) Discharge  LOS: 1 day    Bettylee Feig K. 09/11/2011

## 2011-09-14 ENCOUNTER — Encounter (HOSPITAL_COMMUNITY): Payer: Self-pay | Admitting: General Surgery

## 2011-09-19 NOTE — Discharge Summary (Signed)
Physician Discharge Summary  Patient ID: Leslie Bray MRN: [id number] DOB/AGE: [birth year] 36 y.o.  Admit date: 09/10/2011 Discharge date: 09/11/2011  Admission Diagnoses: Chronic cholecystitis H/o Laparoscopic Roux-en-Y gastric bypass  Discharge Diagnoses:  Chronic cholecystitis s/p Laparoscopic Cholecystectomy with IOC H/o Laparoscopic Roux-en-Y gastric bypass  Discharged Condition: good  Hospital Course: 36 yo HF taken to the operating room for a planned laparoscopic cholecystectomy with IOC for probable chronic cholecystitis. She was kept overnight for pain. On POD 1 she was tolerating liquids, her pain was controlled, her vitals were stable, she was ambulating and was deemed stable for discharge.   Consults: None  Significant Diagnostic Studies: none  Treatments: IV hydration, analgesia: acetaminophen and surgery: LAPAROSCOPIC CHOLECYSTECTOMY WITH IOC  Discharge Exam: Blood pressure 96/58, pulse 96, temperature 98.3 F (36.8 C), temperature source Oral, resp. rate 18, height 5\' 6"  (1.676 m), weight 152 lb 1.9 oz (69 kg), last menstrual period 08/25/2011, SpO2 100.00%. Alert, nad cta Reg Soft, nondistended, expected tenderness, incisions c/d/i  Disposition: 01-Home or Self Care  Discharge Orders    Future Appointments: Provider: Department: Dept Phone: Center:   09/29/2011 12:00 PM Atilano Ina, MD,FACS [facility] [phone] None     Future Orders Please Complete By Expires   Diet general      Increase activity slowly      May walk up steps      May shower / Bathe      Driving Restrictions      Comments:   Do not drive while taking pain medications   Call MD for:  temperature >100.4      Call MD for:  persistant nausea and vomiting      Call MD for:  severe uncontrolled pain      Call MD for:  redness, tenderness, or signs of infection (pain, swelling, redness, odor or green/yellow discharge around incision site)        Medication List  As of  09/19/2011  7:36 AM   TAKE these medications         cyclobenzaprine 10 MG tablet   Commonly known as: FLEXERIL   Take 10 mg by mouth 3 (three) times daily as needed. Muscle spasm.      ergocalciferol 50000 UNITS capsule   Commonly known as: VITAMIN D2   Take 50,000 Units by mouth once a week.      fluticasone 50 MCG/ACT nasal spray   Commonly known as: FLONASE   Place 2 sprays into the nose as needed. Nasal allergies      mulitivitamin with minerals Tabs   Take 1 tablet by mouth daily.      oxyCODONE-acetaminophen 5-325 MG per tablet   Commonly known as: PERCOCET   Take 1-2 tablets by mouth every 4 (four) hours as needed for pain.      oxyCODONE-acetaminophen 5-325 MG per tablet   Commonly known as: PERCOCET   Take 1-2 tablets by mouth every 4 (four) hours as needed.      pregabalin 75 MG capsule   Commonly known as: LYRICA   Take 75 mg by mouth daily.      ZYRTEC PO   Take 10 mg by mouth daily.           Follow-up Information    Follow up with Atilano Ina, MD,FACS in 3 weeks.   Contact information:   [employer] [address], [address] [place] [address] [phone]  Mary Sella. Andrey Campanile, MD, FACS General, Bariatric, & Minimally Invasive Surgery [facility], [place]   Signed: Atilano Ina 09/19/2011, 7:36 AM

## 2011-09-29 ENCOUNTER — Encounter (INDEPENDENT_AMBULATORY_CARE_PROVIDER_SITE_OTHER): Payer: Self-pay | Admitting: General Surgery

## 2011-09-29 ENCOUNTER — Ambulatory Visit (INDEPENDENT_AMBULATORY_CARE_PROVIDER_SITE_OTHER): Payer: Commercial Managed Care - PPO | Admitting: General Surgery

## 2011-09-29 VITALS — BP 100/62 | HR 74 | Temp 98.0°F | Resp 14 | Ht 66.0 in | Wt 147.4 lb

## 2011-09-29 DIAGNOSIS — Z09 Encounter for follow-up examination after completed treatment for conditions other than malignant neoplasm: Secondary | ICD-10-CM

## 2011-09-29 NOTE — Progress Notes (Signed)
Chief complaint: Postop  Procedure: Status post laparoscopic cholecystectomy with cholangiogram May 24  History of Present Ilness: [name] with a history of Roux-en-Y gastric bypass comes in today for her first postoperative appointment. She states that she did well after surgery. She stayed in the hospital overnight for observation. The pain in her back has resolved. She is still having some intermittent left upper quadrant pain. It is relieved with food intake. She denies any fevers or chills. She denies any diarrhea or constipation. She denies any nausea or vomiting. She is no longer taking pain medication  Physical Exam: BP 100/62  Pulse 74  Temp 98 F (36.7 C) (Temporal)  Resp 14  Ht 5\' 6"  (1.676 m)  Wt 147 lb 6.4 oz (66.86 kg)  BMI 23.79 kg/m2  LMP 08/25/2011  Gen: alert, NAD, non-toxic appearing Pupils: equal, no scleral icterus Pulm: Lungs clear to auscultation, symmetric chest rise CV: regular rate and rhythm Abd: soft, nontender, nondistended. Well-healed trocar sites. No cellulitis. No incisional hernia Ext: no edema, no calf tenderness Skin: no rash, no jaundice   Pathology: Chronic cholecystitis and cholelithiasis  Assessment and Plan: 36 year old female status post laparoscopic Roux-en-Y gastric bypass in [place] & status post laparoscopic cholecystectomy for chronic cholecystitis and cholelithiasis - doing well  We discussed her pathology report.  I'm glad her back pain is resolved. However she still has some persistent intermittent left upper quadrant pain. This may be related to her gastric bypass. I advised her to start taking an over-the-counter reflux medication such as Prilosec. If she is still symptomatic in 6 weeks, I will consider getting an upper endoscopy to evaluate for an anastomotic marginal ulcer.  Since she is a year and half out from her bypass surgery we will check some standard routine surveillance labs  Followup 6  weeks  Mary Sella. Andrey Campanile, MD, FACS General, Bariatric, & Minimally Invasive Surgery [facility], [place]

## 2011-09-29 NOTE — Patient Instructions (Signed)
Try taking an over the counter reflux medication like prilosec.

## 2011-10-07 ENCOUNTER — Encounter (INDEPENDENT_AMBULATORY_CARE_PROVIDER_SITE_OTHER): Payer: Self-pay

## 2011-10-19 ENCOUNTER — Telehealth (INDEPENDENT_AMBULATORY_CARE_PROVIDER_SITE_OTHER): Payer: Self-pay

## 2011-10-19 MED ORDER — SUCRALFATE 1 GM/10ML PO SUSP: 1.0000 g | Freq: Two times a day (BID) | ORAL | Status: DC

## 2011-10-19 NOTE — Telephone Encounter (Signed)
Do you want me to refer her to GI for an upper endoscopy?

## 2011-10-19 NOTE — Telephone Encounter (Signed)
i'm concerned she may have an ulcer. So yes she needs an EGD.  In meantime, instruct pt to start Prilosec OTC and carafate. i will schedule her for EGD next week by me. carafate sent electronically to pharmacy. i will call pt tomorrow to speak over phone. 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

## 2011-10-19 NOTE — Addendum Note (Signed)
Addended by: Andrey Campanile ERIC M on: 10/19/2011 05:23 PM   Modules accepted: Orders

## 2011-10-19 NOTE — Telephone Encounter (Signed)
Pt states she was to call Dr Andrey Campanile if she continued to have left side/ rib pain after surgery. She states he was considering a endoscopy. Pt states she is still having the same discomfort since before surgery. Pt advised I will send msg to Dr Andrey Campanile and his assistant to review.

## 2011-10-20 ENCOUNTER — Telehealth (INDEPENDENT_AMBULATORY_CARE_PROVIDER_SITE_OTHER): Payer: Self-pay

## 2011-10-20 ENCOUNTER — Telehealth (INDEPENDENT_AMBULATORY_CARE_PROVIDER_SITE_OTHER): Payer: Self-pay | Admitting: General Surgery

## 2011-10-20 NOTE — Telephone Encounter (Signed)
Spoke with pt regarding my concern for possible marginal ulcer. Explained that i wanted to start empiric txt with prilosec OTC and carafate. Told her to pick up carafate at pharmacy- rx sent in yesterday. Also explained rationale for EGD. Explained our office would contact her to schedule it. Pt voiced understanding

## 2011-10-20 NOTE — Telephone Encounter (Signed)
Pt called to report Rx not at [facility].  I called the pharmacy with the rx for Carafate.  Pt is aware.

## 2011-10-22 ENCOUNTER — Other Ambulatory Visit (INDEPENDENT_AMBULATORY_CARE_PROVIDER_SITE_OTHER): Payer: Self-pay | Admitting: General Surgery

## 2011-11-09 ENCOUNTER — Encounter (HOSPITAL_COMMUNITY)
Admission: RE | Disposition: A | Discharge status: Home / Self Care | Payer: Self-pay | Source: Ambulatory Visit | Attending: General Surgery

## 2011-11-09 ENCOUNTER — Encounter (HOSPITAL_COMMUNITY): Payer: Self-pay

## 2011-11-09 ENCOUNTER — Ambulatory Visit (HOSPITAL_COMMUNITY)
Admission: RE | Admit: 2011-11-09 | Discharge: 2011-11-09 | Disposition: A | Discharge status: Home / Self Care | Payer: Commercial Managed Care - PPO | Source: Ambulatory Visit | Attending: General Surgery | Admitting: General Surgery

## 2011-11-09 DIAGNOSIS — IMO0001 Reserved for inherently not codable concepts without codable children: Secondary | ICD-10-CM | POA: Insufficient documentation

## 2011-11-09 DIAGNOSIS — R1012 Left upper quadrant pain: Secondary | ICD-10-CM

## 2011-11-09 DIAGNOSIS — Z9884 Bariatric surgery status: Secondary | ICD-10-CM | POA: Insufficient documentation

## 2011-11-09 DIAGNOSIS — Z9089 Acquired absence of other organs: Secondary | ICD-10-CM | POA: Insufficient documentation

## 2011-11-09 DIAGNOSIS — Z79899 Other long term (current) drug therapy: Secondary | ICD-10-CM | POA: Insufficient documentation

## 2011-11-09 HISTORY — PX: ESOPHAGOGASTRODUODENOSCOPY: SHX5428

## 2011-11-09 SURGERY — EGD (ESOPHAGOGASTRODUODENOSCOPY)
Anesthesia: Moderate Sedation

## 2011-11-09 MED ORDER — MIDAZOLAM HCL 10 MG/2ML IJ SOLN
INTRAMUSCULAR | Status: DC | PRN
  Administered 2011-11-09: 2 mg via INTRAVENOUS
  Administered 2011-11-09 (×2): 1 mg via INTRAVENOUS

## 2011-11-09 MED ORDER — FENTANYL CITRATE 0.05 MG/ML IJ SOLN
INTRAMUSCULAR | Status: AC
Start: 2011-11-09 — End: 2011-11-09
  Filled 2011-11-09: qty 2

## 2011-11-09 MED ORDER — SODIUM CHLORIDE 0.9 % IV SOLN
Freq: Once | INTRAVENOUS | Status: AC
  Administered 2011-11-09: 500 mL via INTRAVENOUS

## 2011-11-09 MED ORDER — FENTANYL CITRATE 0.05 MG/ML IJ SOLN
INTRAMUSCULAR | Status: DC | PRN
  Administered 2011-11-09 (×2): 50 ug via INTRAVENOUS

## 2011-11-09 MED ORDER — MIDAZOLAM HCL 10 MG/2ML IJ SOLN
INTRAMUSCULAR | Status: AC
  Filled 2011-11-09: qty 2

## 2011-11-09 MED ORDER — DIPHENHYDRAMINE HCL 50 MG/ML IJ SOLN
INTRAMUSCULAR | Status: AC
  Filled 2011-11-09: qty 1

## 2011-11-09 NOTE — H&P (Signed)
Leslie Bray is an 36 y.o. female.   Chief Complaint: here for egd HPI: [name] is referred by Dr. Quentin Angst for evaluation for gallbladder removal. The patient underwent a laparoscopic Roux-en-Y gastric bypass in [place] January 2012. She has lost a total of 70 pounds since surgery. She states that she's been having abdominal problems for about one year. Initially it was very intermittent. It started as upper back pain in the midline and she described it as a mild discomfort. He would last for 2-3 days. She states that she was diagnosed with fibromyalgia. She states she had an ultrasound done last year which demonstrated reportedly sludge in her gallbladder. However over the past several months she has been having more frequent episodes of discomfort. She states that this is different than dumping. She has had one to 2 episodes of dumping syndrome and this is completely different. She now has episodes that occur every week that lasts for 2-3 days at a time. The discomfort starts in her epigastrium and on her left side and radiates to her back. She describes it as almost like a band across her upper abdomen. She denies any vomiting. She denies any fevers or chills. She denies any melena, hematochezia, constipation, diarrhea, acholic stools. She had a severe episode of chest discomfort this past Saturday which prompted her to go to the emergency room at [facility]. She states that an ultrasound and lab work was performed. She states that since there was no evidence of infection she was sent home. She denies taking any NSAID. She is not a smoker. She denies any jaundice.  Since she was last seen she reports ongoing typical symptoms. She denies f/c/cp/sob/ED trips/new meds/allergies  She has subsequently undergone lap cholecystectomy which did not improve her symptoms postoperatively.  My suspicion was that she had an marginal ulcer so we treated her empirically with carrafate and  nexium for the past 2.5 weeks. Today, she reports her left sided pain has resolved.   Past Medical History  Diagnosis Date  . Vitamin d deficiency   . Allergy     seasonal  . Low blood sugar   . UTI (urinary tract infection)   . Fibromyalgia   . Refusal of blood transfusions as patient is Jehovah's Witness 09/10/11  . Low blood sugar     Past Surgical History  Procedure Date  . Lasik 2004, 2010    bilateral  . Laparoscopic gastric bypass 05/09/10    [place]  . Cholecystectomy 09/10/11  . Cholecystectomy 09/10/2011    Procedure: LAPAROSCOPIC CHOLECYSTECTOMY WITH INTRAOPERATIVE CHOLANGIOGRAM;  Surgeon: Atilano Ina, MD,FACS;  Location: [facility] OR;  Service: General;  Laterality: N/A;    Family History  Problem Relation Age of Onset  . Hyperlipidemia Mother    Social History:  reports that she has never smoked. She has never used smokeless tobacco. She reports that she drinks alcohol. She reports that she does not use illicit drugs.  Allergies: No Known Allergies  Medications Prior to Admission  Medication Sig Dispense Refill  . Cetirizine HCl (ZYRTEC PO) Take 10 mg by mouth daily.       . cyclobenzaprine (FLEXERIL) 10 MG tablet Take 10 mg by mouth 3 (three) times daily as needed. Muscle spasm.      . ergocalciferol (VITAMIN D2) 50000 UNITS capsule Take 50,000 Units by mouth once a week.      . fluticasone (FLONASE) 50 MCG/ACT nasal spray Place 2 sprays into the nose as needed.  Nasal allergies      . Multiple Vitamin (MULITIVITAMIN WITH MINERALS) TABS Take 1 tablet by mouth daily.      Marland Kitchen omeprazole (PRILOSEC) 10 MG capsule Take 10 mg by mouth daily.      . pregabalin (LYRICA) 75 MG capsule Take 75 mg by mouth daily.      . sucralfate (CARAFATE) 1 GM/10ML suspension Take 10 mLs (1 g total) by mouth 2 (two) times daily.  420 mL  0    No results found for this or any previous visit (from the past 48 hour(s)). No results found.  Review of Systems  Constitutional: Negative for fever  and chills.  Eyes: Negative for blurred vision and double vision.  Respiratory: Negative for shortness of breath and wheezing.   Cardiovascular: Negative for chest pain, palpitations and orthopnea.  Musculoskeletal: Negative for joint pain.  Neurological: Negative for seizures and loss of consciousness.  Psychiatric/Behavioral: Negative for depression, suicidal ideas and substance abuse.  All other systems reviewed and are negative.    Blood pressure 110/69, pulse 59, temperature 98.6 F (37 C), resp. rate 27, height 5\' 6"  (1.676 m), weight 150 lb (68.04 kg), SpO2 97.00%. Physical Exam  Vitals reviewed. Constitutional: She is oriented to person, place, and time. She appears well-developed and well-nourished. No distress.  HENT:  Head: Normocephalic and atraumatic.  Eyes: Conjunctivae are normal. No scleral icterus.  Neck: Neck supple.  Cardiovascular: Normal rate.   Respiratory: Effort normal and breath sounds normal. No respiratory distress.  GI: Soft. Bowel sounds are normal. She exhibits no distension. There is no tenderness.  Musculoskeletal: She exhibits no edema.  Neurological: She is alert and oriented to person, place, and time.  Skin: Skin is warm and dry. She is not diaphoretic.  Psychiatric: She has a normal mood and affect. Her behavior is normal. Judgment and thought content normal.     Assessment/Plan Left upper quadrant pain H/o lap roux-en-y gastric bypass.  Because of her prior bypass and ongoing symptoms I recommended an upper endoscopy to evaluate her anatomy to r/o pathology such as stricture, ulcer, large pouch.  She agreed with the plan. We discussed the risk & benefits of the procedure including but not limited to bleeding, perforation, failure to diagnosis, aspiration, cardiac and pulmonary complications, conscious sedation side effects.  Mary Sella. Andrey Campanile, MD, FACS General, Bariatric, & Minimally Invasive Surgery [facility], [place]   [facility]  M 11/09/2011, 11:32 AM

## 2011-11-09 NOTE — Op Note (Signed)
TANETTE CHAUCA Lossantos [birth year] [id number] 11/09/2011   Preoperative diagnosis: Left upper quadrant pain; h/o Laparoscopic Roux-En-Y gastric bypass  Postoperative diagnosis: Same   Procedure: Esophagogastrojejunoscopy   Surgeon: Mary Sella. Wilson M.D., FACS   Anesthesia: Conscious sedation: 4 mg versed; 100 microgram IV fentanyl; topical cecaine  Indications for procedure: this is a very pleasant [name] who underwent a laparoscopic Roux-en-Y gastric bypass in [place] about a year ago. She presented to the office several weeks ago with symptoms of upper abdominal pain mainly epigastric and left upper quadrant. She did have some bloating as well as some nausea. She had an abdominal ultrasound which revealed gallbladder sludge. We ended up removing her gallbladder and she did well for several weeks; however, her abdominal pain returned. Her symptoms were now suggestive of a marginal ulcer. I empirically started her on a PPI as well as Carafate. However I did recommend an upper endoscopy to evaluate her gastric pouch to rule out any evidence of abnormal pathology. We discussed at length the risk and benefits of the procedure including but not limited to bleeding, perforation, failure to diagnose, injury to surrounding structures, respiratory depression, adverse reaction to sedation, aspiration. She elected to proceed with the procedure  Description of procedure: after obtaining informed consent, a procedure timeout was performed. The procedure was performed in the endoscopy suite at [facility]. The patient was under cardiac monitoring as well as continuous pulse oximetry. IV sedation was started. After adequate sedation was achieved, I gently placed endoscope in the patient's oropharynx and gently glided it down the esophagus without any difficulty under direct visualization. Once I was in the gastric pouch, I insufflated the pouch was air. The pouch was approximately 5cm in  size. The distance from the incisors to the GE junction was 39 cm. Her gastrojejunostomy anastomosis was at around 44 cm. I was able to cannulate and advanced the scope through the gastrojejunostomy into the roux limb which appeared normal. .  Upon further inspection of the gastric pouch, the mucosa appeared normal. There is no evidence of any mucosal abnormality. There appeared to be no evidence of gastro-gastric fistula.  The gastric pouch and Roux limb were decompressed. The width of the gastrojejunal anastomosis was at least 2.5 cm. The distal esophagus appeared normal - no evidence of mucosal irritation. The scope was withdrawn after the pouch and roux limb were decompressed.  The patient tolerated the procedure well.   Mary Sella. Andrey Campanile, MD, FACS General, Bariatric, & Minimally Invasive Surgery [facility], [place]

## 2011-11-10 ENCOUNTER — Encounter (HOSPITAL_COMMUNITY): Payer: Self-pay | Admitting: General Surgery

## 2011-11-10 ENCOUNTER — Encounter (HOSPITAL_COMMUNITY): Payer: Self-pay

## 2011-11-19 LAB — CBC WITH DIFFERENTIAL/PLATELET
Basophils Absolute: 0 10*3/uL (ref 0.0–0.1)
Basophils Relative: 1 % (ref 0–1)
Eosinophils Absolute: 0.2 10*3/uL (ref 0.0–0.7)
Eosinophils Relative: 4 % (ref 0–5)
HCT: 37.6 % (ref 36.0–46.0)
Hemoglobin: 12.2 g/dL (ref 12.0–15.0)
Lymphocytes Relative: 32 % (ref 12–46)
Lymphs Abs: 1.8 10*3/uL (ref 0.7–4.0)
MCH: 26.6 pg (ref 26.0–34.0)
MCHC: 32.4 g/dL (ref 30.0–36.0)
MCV: 82.1 fL (ref 78.0–100.0)
Monocytes Absolute: 0.5 10*3/uL (ref 0.1–1.0)
Monocytes Relative: 8 % (ref 3–12)
Neutro Abs: 3.2 10*3/uL (ref 1.7–7.7)
Neutrophils Relative %: 55 % (ref 43–77)
Platelets: 307 10*3/uL (ref 150–400)
RBC: 4.58 MIL/uL (ref 3.87–5.11)
RDW: 13.8 % (ref 11.5–15.5)
WBC: 5.7 10*3/uL (ref 4.0–10.5)

## 2011-11-20 LAB — LIPID PANEL
Cholesterol: 116 mg/dL (ref 0–200)
HDL: 38 mg/dL — ABNORMAL LOW (ref >39)
LDL Cholesterol: 67 mg/dL (ref 0–99)
Total CHOL/HDL Ratio: 3.1 Ratio
Triglycerides: 57 mg/dL (ref <150)
VLDL: 11 mg/dL (ref 0–40)

## 2011-11-20 LAB — COMPREHENSIVE METABOLIC PANEL
ALT: 42 U/L — ABNORMAL HIGH (ref 0–35)
AST: 38 U/L — ABNORMAL HIGH (ref 0–37)
Albumin: 4.1 g/dL (ref 3.5–5.2)
Alkaline Phosphatase: 74 U/L (ref 39–117)
BUN: 14 mg/dL (ref 6–23)
CO2: 25 mEq/L (ref 19–32)
Calcium: 9.4 mg/dL (ref 8.4–10.5)
Chloride: 107 mEq/L (ref 96–112)
Creat: 0.62 mg/dL (ref 0.50–1.10)
Glucose, Bld: 76 mg/dL (ref 70–99)
Potassium: 4.9 mEq/L (ref 3.5–5.3)
Sodium: 140 mEq/L (ref 135–145)
Total Bilirubin: 0.5 mg/dL (ref 0.3–1.2)
Total Protein: 7 g/dL (ref 6.0–8.3)

## 2011-11-20 LAB — IRON: Iron: 58 ug/dL (ref 42–145)

## 2011-11-20 LAB — IBC PANEL
%SAT: 11 % — ABNORMAL LOW (ref 20–55)
TIBC: 520 ug/dL — ABNORMAL HIGH (ref 250–470)
UIBC: 462 ug/dL — ABNORMAL HIGH (ref 125–400)

## 2011-11-20 LAB — VITAMIN D 25 HYDROXY (VIT D DEFICIENCY, FRACTURES): Vit D, 25-Hydroxy: 41 ng/mL (ref 30–89)

## 2011-11-20 LAB — VITAMIN B12: Vitamin B-12: >2000 pg/mL — ABNORMAL HIGH (ref 211–911)

## 2011-11-20 LAB — FOLATE: Folate: 4.3 ng/mL

## 2011-11-20 LAB — MAGNESIUM: Magnesium: 2.1 mg/dL (ref 1.5–2.5)

## 2011-11-26 ENCOUNTER — Encounter (INDEPENDENT_AMBULATORY_CARE_PROVIDER_SITE_OTHER): Payer: Self-pay | Admitting: General Surgery

## 2011-11-26 ENCOUNTER — Ambulatory Visit (INDEPENDENT_AMBULATORY_CARE_PROVIDER_SITE_OTHER): Payer: Commercial Managed Care - PPO | Admitting: General Surgery

## 2011-11-26 VITALS — BP 100/70 | HR 74 | Temp 98.6°F | Ht 66.0 in | Wt 153.0 lb

## 2011-11-26 DIAGNOSIS — Z9884 Bariatric surgery status: Secondary | ICD-10-CM

## 2011-11-26 MED ORDER — OMEPRAZOLE 10 MG PO CPDR: 10.0000 mg | DELAYED_RELEASE_CAPSULE | Freq: Every day | ORAL | Status: DC

## 2011-11-26 NOTE — Progress Notes (Signed)
Subjective:     Patient ID: Clancy Gourd, female   DOB: [birth year], 36 y.o.   MRN: [id number]  HPI [name] comes in for followup after undergoing an upper endoscopy on July 23 for left upper quadrant pain. She also underwent a laparoscopic cholecystectomy on the 24th. She had undergone a laparoscopic Roux-en-Y gastric bypass in [place] in early 2012. I was concerned that the left upper quadrant pain was due to a marginal ulcer. We started her on empiric Prilosec OTC as well as Carafate. On the day of her endoscopy, she stated that her left upper quadrant pain radiating to her back it resolved. Her upper endoscopy was unremarkable. There is no evidence of esophagitis, gastritis or ulcer. The Anatomy of the pouch appeared normal. We also checked annual bariatric bypass labs on her. She comes in to discuss the labs as well as for followup  She states that she ran out of the reflux medication about a week ago and she hasn't taken any in about a week. She states about 2 days after she stopped taking the medication the left upper quadrant pain returned. She denies any fever, chills, nausea, regurgitation, diarrhea or constipation.    Review of Systems     Objective:   Physical Exam BP 100/70  Pulse 74  Temp 98.6 F (37 C) (Temporal)  Ht 5\' 6"  (1.676 m)  Wt 153 lb (69.4 kg)  BMI 24.69 kg/m2  SpO2 100%  See LapBand flowsheet  Gen: alert, NAD, non-toxic appearing HEENT: normocephalic, atraumatic; pupils equal, no scleral icterus,  Ext: no edema, normal, symmetric strength Neuro: nonfocal, sensation grossly intact Psych: appropriate, judgment normal     Assessment:     History of laparoscopic Roux-en-Y gastric bypass in [place] Status post laparoscopic cholecystectomy May 24 Status post upper endoscopy July 23     Plan:     I reviewed the findings of her upper endoscopy with her. I believe she has some ongoing reflux despite having a gastric bypass. She was  given a prescription for Prilosec. We reviewed her bariatric labs. Most of them were all normal. Her vitamin B 12 level was greater than 2000. She is currently taking 4 tablets per day of vitamin B 12 at 2500 mcg per tablet. I have advised her to stop taking the B12 for about 2 weeks. We will repeat a vitamin B 12 level in about 3-4 weeks.She should only really need about 350-500 micrograms/day of b-12 supplementation. We will restart her at around 350-459mcg/day once her B12 levels normalize.   Also, some of her iron panel was a little bit abnormal. She is not anemic and her iron level is normal; however, her percent saturation was a little low. I explained that I would have to investigate this and get back with her. Followup 1 year  Mary Sella. Andrey Campanile, MD, FACS General, Bariatric, & Minimally Invasive Surgery [facility], [place]

## 2011-11-26 NOTE — Patient Instructions (Signed)
Stop taking the vit Vit B12 for 2 weeks. We will recheck in it in about a month. We will call you a new dosage for the B-12 daily supplement.

## 2012-02-08 ENCOUNTER — Telehealth (INDEPENDENT_AMBULATORY_CARE_PROVIDER_SITE_OTHER): Payer: Self-pay | Admitting: General Surgery

## 2012-02-08 NOTE — Telephone Encounter (Signed)
Called patient and she has not had vitamin b 12 drawn. She will have drawn this week.

## 2012-02-25 ENCOUNTER — Telehealth (INDEPENDENT_AMBULATORY_CARE_PROVIDER_SITE_OTHER): Payer: Self-pay

## 2012-02-25 NOTE — Telephone Encounter (Signed)
Pt called today to have her lab orders faxed to [facility].  She was due to have them drawn in September, but she forgot.  Does Dr. Andrey Campanile still want her to go?  Pls advise.

## 2012-02-25 NOTE — Telephone Encounter (Signed)
Labs interfaced into [facility] system. Patient made aware, again, to get these drawn. Lab slip also faxed to patient [phone] per her request.

## 2012-02-26 LAB — VITAMIN B12: Vitamin B-12: 596 pg/mL (ref 211–911)

## 2012-02-29 ENCOUNTER — Telehealth (INDEPENDENT_AMBULATORY_CARE_PROVIDER_SITE_OTHER): Payer: Self-pay | Admitting: General Surgery

## 2012-02-29 NOTE — Telephone Encounter (Signed)
Patient made aware her vitamin b12 is normal. She will call with any problems.

## 2012-04-18 ENCOUNTER — Other Ambulatory Visit: Payer: Self-pay

## 2012-04-19 NOTE — L&D Delivery Note (Signed)
Delivery Note At 9:39 AM a viable and healthy female was delivered via Vaginal, Spontaneous Delivery (Presentation: Left Occiput Anterior).  APGAR: 9, 9; weight .-pending Placenta status: Intact, Spontaneous.  Cord: 3 vessels with the following complications: None.  Cord pH: n/a  Anesthesia: Epidural  Episiotomy: None Lacerations: 2nd degree;Perineal;Vaginal Suture Repair: 3.0 vicryl rapide Est. Blood Loss (mL): 350cc  Mom to postpartum.  Baby to nursery-stable.  MODY,VAISHALI R 11/19/2012, 10:40 AM

## 2012-06-06 ENCOUNTER — Encounter (HOSPITAL_COMMUNITY): Payer: Self-pay

## 2012-06-06 ENCOUNTER — Ambulatory Visit (HOSPITAL_COMMUNITY)
Admission: RE | Admit: 2012-06-06 | Discharge: 2012-06-06 | Disposition: A | Discharge status: Home / Self Care | Payer: Commercial Managed Care - PPO | Source: Ambulatory Visit | Attending: Obstetrics & Gynecology | Admitting: Obstetrics & Gynecology

## 2012-06-06 DIAGNOSIS — O99840 Bariatric surgery status complicating pregnancy, unspecified trimester: Secondary | ICD-10-CM

## 2012-06-06 NOTE — Consult Note (Signed)
Maternal Fetal Medicine Consultation  Requesting Provider(s): Filbert Berthold, MD  Reason for consultation: History of gastric bypass  HPI: Leslie Bray is a 37 yo G1P0 currently at 30 0/7 weeks who was seen for consultation today due to a history of gastric bypass. The patient reports that she underwent a R-Y gastric bypass procedure in [place] in Jan 2012.  She subsequently experienced a 70 # weight loss.  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  She has not had any problems with anemia or B12 deficiency to her knowledge.  The patient's prenatal course has otherwise been uncomplicated.  She had a normal first trimester screen and declines any additional testing (see note from [employer]film[employer]).  OB History: OB History   Grav Para Term Preterm Abortions TAB SAB Ect Mult Living   1               PMH:  Past Medical History  Diagnosis Date  . Vitamin D deficiency   . Allergy     seasonal  . Low blood sugar   . UTI (urinary tract infection)   . Fibromyalgia   . Refusal of blood transfusions as patient is Jehovah's Witness 09/10/11  . Low blood sugar     PSH:  Past Surgical History  Procedure Laterality Date  . Lasik  2004, 2010    bilateral  . Laparoscopic gastric bypass  05/09/10    [place]  . Cholecystectomy  09/10/11  . Cholecystectomy  09/10/2011    Procedure: LAPAROSCOPIC CHOLECYSTECTOMY WITH INTRAOPERATIVE CHOLANGIOGRAM;  Surgeon: Atilano Ina, MD,FACS;  Location: [facility] OR;  Service: General;  Laterality: N/A;  . Esophagogastroduodenoscopy  11/09/2011    Procedure: ESOPHAGOGASTRODUODENOSCOPY (EGD);  Surgeon: Atilano Ina, MD,FACS;  Location: [facility];  Service: General;  Laterality: N/A;   Meds: Claritin, Flonase prn, prenatal vitamins   Allergies: NKDA  FH: family history of muscular dystrophy (see note from [employer], [employer]/film/video)  Soc: denies tobacco use or ETOH use  since becoming pregnant  Review of Systems: no vaginal bleeding or cramping/contractions, no LOF, no nausea/vomiting. All other systems reviewed and are negative.   A/P: 1) Single IUP at 15 0/7 weeks 2) Advanced maternal age - see note from [employer], broadcasting/film/video.  The patient declines additional testing (NIPT / amniocentesis).  She was offered a detailed / "genetic" ultrasound at 18 weeks - undecided at this point. 3) History of gastric bypass: Bariatric surgery may be considered in patients with class III obesity, i.e., BMI greater than 40 or BMI greater than 35 with comorbid conditions, if non surgical modalities have failed. Bariatric surgical procedures can be categorized into two primary types: 1) malabsorptive procedures such as Roux-en-Y gastric bypass, and 2) restrictive procedures such as laparoscopic adjustable gastric banding. The previously performed malabsorptive procedures were associated with complications during pregnancy, such as small bowel ischemia, as well as nutrient deficiencies, such as iron, folate, and B12 deficiencies.  [name] underwent a malabsorptive procedure.    The following recommendations of ACOG Committee Opinion No. 315 are endorsed:   Patients with laparoscopic R&Y should be advised that they are at risk of becoming pregnant unexpectedly after weight loss following surgery and should use appropriate contraceptive methods.   All patients undergoing laparoscopic R&Y should delay pregnancy for 12-18 months to avoid the rapid weight loss phase of the procedure until their weight stabilizes and they are no longer catabolic.  All women should have adequate supplementation of folate, calcium, and B12 after any bariatric surgical procedure because this may decrease the risk of subclinical nutritional deficiencies.  Additional nutritional supplementation and close monitoring of fetal growth is necessary in women who have undergone diversionary or malabsorptive  procedures.  Recommend: 1) Check baseline micronutrient levels: Folate, B12, serum ferritin and prealbumin.  If prealbumin is low, would recommend a nutrition consult - may require supplements (i.e. Ensure milk shakes) 2) Would empirically add Calcium citrate / vitamin D supplements (Oscal) and folic acid 1mg  daily in addition to the patients prenatal vitamins 3) If serum ferritin is low or the iron deficiency anemia is noted, would add supplemental iron.  In some cases, due to malabsorption, IV iron may be required. 4) If B12 is low, would also begin B12 supplements.  Oral formulations are available. 5) The patient is unable to tolerate Glucola due to risk of dumping syndrome.  I would recommend a glucometer and test strips - would check fasting and post prandial values for approximately 1 week at [redacted] weeks gestation as a screening exam. 6) There may be an increased risk of fetal growth restriction in women who have undergone gastric bypass. Recommend a screening growth scan at approximately 28-[redacted] weeks gestation.   Thank you for the opportunity to be a part of the care of [name]. Please contact our office if we can be of further assistance.   I spent approximately 30 minutes with this patient with over 50% of time spent in face-to-face counseling.  Alpha Gula, MD Maternal Fetal Medicine

## 2012-06-08 NOTE — Progress Notes (Signed)
Genetic Counseling  High-Risk Gestation Note  Appointment Date:  06/06/2012 Referred By: Delbert Harness, MD Date of Birth:  [birth year] Partner:  Shon Hale    Pregnancy History: G1P0 Estimated Date of Delivery: 11/28/12 Estimated Gestational Age: 104w0d Attending: Alpha Gula, MD  Ms. Clancy Gourd, and her partner, Mr. Shon Hale, were seen for genetic counseling regarding a maternal age of 37 y.o. and family history of muscular dystrophy.   They were counseled regarding maternal age and the association with risk for chromosome conditions due to nondisjunction with aging of the ova.   We reviewed chromosomes, nondisjunction, and the associated 1 in 111 risk for fetal aneuploidy related to a maternal age of 37 y.o. at [redacted]w[redacted]d weeks gestation.  They were counseled that the risk for aneuploidy decreases as gestational age increases, accounting for those pregnancies which spontaneously abort.  We specifically discussed Down syndrome (trisomy 52), trisomies 102 and 61, and sex chromosome aneuploidies (47,XXX and 47,XXY) including the common features and prognoses of each.   We also reviewed Ms. Hansini J De Lossantos' First trimester screen result and the associated reduction in risks for fetal Down syndrome (1 in 171 to 1 in 1600) and trisomy 18 (1 in 336 to less than 1 in 10,000).  They understand that First trimester screening provides a pregnancy specific risk for these conditions, but is not considered to be diagnostic.   We reviewed other available screening options including noninvasive prenatal testing (NIPT) and detailed ultrasound. Specifically, we discussed that NIPT analyzes cell free fetal DNA found in the maternal circulation. This test is not diagnostic for chromosome conditions, but can provide information regarding the presence or absence of extra fetal DNA for chromosomes 13, 18, 21, X, and Y, and missing fetal DNA for chromosome X and Y (Turner syndrome). Thus, it  would not identify or rule out all genetic conditions. The reported detection rate is greater than 99% for Trisomy 21, greater than 98% for Trisomy 18, and is approximately 80% (8 out of 10) for Trisomy 13. The false positive rate is reported to be less than 0.1% for any of these conditions.  In addition, we discussed that ~50-80% of fetuses with Down syndrome and up to 90-95% of fetuses with trisomy 18/13, when well visualized, have detectable anomalies or soft markers by detailed ultrasound (~18+ weeks gestation). They were also counseled regarding diagnostic testing via amniocentesis.  We reviewed the approximate 1 in 300-500 risk for complications for amniocentesis, including spontaneous pregnancy loss. We discussed the risks, limitations, and benefits of each screening and testing option.   After consideration of all the options, the couple declined NIPT and amniocentesis, stating that they do not desire to know the information provided by these tests.  She understands that ultrasound cannot rule out all birth defects or genetic syndromes. Detailed ultrasound is available to the patient, if desired. We are able to perform this in our office, if desired. No follow-up appointments were made at this time.    Ms. Shellee Streng was provided with written information regarding sickle cell anemia (SCA) including the carrier frequency and incidence in the Hispanic population, the availability of carrier testing and prenatal diagnosis if indicated.  In addition, we discussed that hemoglobinopathies are routinely screened for as part of the [facility] newborn screening panel.  We reviewed that Ms. Adrain J De Lossantos had a normal hemoglobin electrophoresis performed through her OB office, indicating that she is not likely a carrier of a hemoglobin variant,  such as sickle cell trait.   Both family histories were reviewed and found to be contributory for Down syndrome for the father of the pregnancy's female  maternal first cousin. The specific type of Down syndrome was not known. We discussed that 95% of cases of Down syndrome are not inherited and are the result of non-disjunction.  Three to 4% of cases of Down syndrome are the result of a translocation involving chromosome #21.  We discussed the option of chromosome analysis to determine if an individual is a carrier of a balanced translocation involving chromosome #21.  If an individual carries a balanced translocation involving chromosome #21, then the chance to have a baby with Down syndrome would be greater than the maternal age-related risk.  We discussed that based on the reported family history, this relative's Down syndrome was most likely sporadic. However, additional information regarding her or Mr. Marcina Millard karyotype may alter recurrence risk assessment.    Additionally, Mr. Carolin Coy reported two paternal relatives with muscular dystrophy: a female paternal first cousin (Mr. Marcina Millard paternal uncle's son) and a female paternal first cousin once removed (Mr. Marcina Millard paternal aunt's daughter's son). Mr. Carolin Coy reported that his first cousin had onset of features of muscular dystrophy at age 97 or 7 years which were progressive and eventually lead to use of a wheelchair. The muscular dystrophy reportedly eventually affected his ability to breathe and he died at age 20 years. He had no affect on his intelligence. Mr. Carolin Coy reported that his first cousin once removed is currently age 9 years old. He is currently in a wheelchair and his speech is difficult to understand. He reportedly has normal intelligence. The specific type of muscular dystrophy for these two relatives is not known by Mr. Carolin Coy. He reported limited information and limited contact with these relatives given that they reside in [place].   We discussed that there are many types of muscular dystrophy and muscle weakness conditions which can be acquired, sporadic or inherited in a number of  ways. We discussed that one type of early-onset inherited muscular dystrophy is called Duchenne muscular dystrophy (DMD), which is characterized by progressive muscle weakness and heart problems beginning early in life. We reviewed that this type of muscular dystrophy is inherited through X-linked inheritance. In X-linked inheritance, males with the causative gene change are affected. This causative gene change can happen for the first time in them or be inherited from a mother who is a carrier. Carrier females typically have no to mild symptoms of the condition. In X-linked inheritance, affected males would be expected to have all carrier daughters and all unaffected sons. Thus, in the case of an X-linked form of muscular dystrophy, it may be possible that the two relatives have different types given that the first cousin is related to the other cousin through his father, who is reportedly unaffected. We reviewed other patterns of inheritance including autosomal recessive and autosomal dominant inheritance.   We discussed that given the reported family history, in the case of an X-linked inherited form of muscular dystrophy, recurrence risk for the current pregnancy would be expected to be low given that Mr. Carolin Coy is reportedly unaffected and the reported relatives are related to him through his father. We discussed that similarly, in the case of an autosomal dominant form, recurrence risk would likely be low given that Mr. Carolin Coy, his father, and his aunt and uncle are reportedly unaffected. We reviewed that in the case of an autosomal recessive form of muscular dystrophy,  Mr. Carolin Coy would be at increased risk to be a carrier, and the risk for recurrence in his offspring would depend upon the carrier status of his partner. Given that Ms. GABRIANA WILMOTT Lossantos has no known family history of muscular dystrophy and is not related to Mr. Carolin Coy, recurrence risk for an autosomal recessive form of muscular  dystrophy in their offspring would likely be low. However, additional information is needed to accurately assess recurrence risk for relatives. The couple understands that in the absence of an identified genetic cause for the muscular dystrophy in the family, prenatal testing via amniocentesis in a pregnancy would not be expected to be informative regarding the condition in the family. Similarly, given the described childhood onset for these relatives, we reviewed that targeted ultrasound in pregnancy would typically not be expected to identify features of muscular dystrophy. Without further information regarding the provided family history, an accurate genetic risk cannot be calculated. Further genetic counseling is warranted if more information is obtained.  Ms. Garielle Mroz denied exposure to environmental toxins or chemical agents. She denied the use of tobacco or street drugs. She reported last drinking alcohol in November, prior to being aware of the pregnancy. The all-or-none period was discussed, meaning exposures that occur in the first 4 weeks of gestation are typically thought to either not affect the pregnancy at all or result in a miscarriage. She denied significant viral illnesses during the course of her pregnancy. Her medical and surgical histories were contributory for gastric bypass surgery. She was seen for MFM consultation at the time of today's visit regarding this history. See separate note for detailed discussion.     I counseled this couple regarding the above risks and available options.  The approximate face-to-face time with the genetic counselor was 40 minutes.  Quinn Plowman, MS Certified Genetic Counselor 06/08/2012

## 2012-07-20 LAB — OB RESULTS CONSOLE ANTIBODY SCREEN: Antibody Screen: NEGATIVE

## 2012-07-20 LAB — OB RESULTS CONSOLE ABO/RH: RH Type: POSITIVE

## 2012-07-20 LAB — OB RESULTS CONSOLE HIV ANTIBODY (ROUTINE TESTING): HIV: NONREACTIVE

## 2012-07-20 LAB — OB RESULTS CONSOLE RUBELLA ANTIBODY, IGM: Rubella: IMMUNE

## 2012-07-20 LAB — OB RESULTS CONSOLE GC/CHLAMYDIA
Chlamydia: NEGATIVE
Gonorrhea: NEGATIVE

## 2012-07-20 LAB — OB RESULTS CONSOLE HEPATITIS B SURFACE ANTIGEN: Hepatitis B Surface Ag: NEGATIVE

## 2012-07-20 LAB — OB RESULTS CONSOLE RPR: RPR: NONREACTIVE

## 2012-09-20 ENCOUNTER — Other Ambulatory Visit: Payer: Self-pay | Admitting: Obstetrics

## 2012-09-23 ENCOUNTER — Inpatient Hospital Stay (HOSPITAL_COMMUNITY)
Admission: AD | Admit: 2012-09-23 | Discharge: 2012-09-23 | Disposition: A | Discharge status: Home / Self Care | Payer: Commercial Managed Care - PPO | Source: Ambulatory Visit | Attending: Obstetrics | Admitting: Obstetrics

## 2012-09-23 ENCOUNTER — Encounter (HOSPITAL_COMMUNITY): Payer: Self-pay | Admitting: *Deleted

## 2012-09-23 ENCOUNTER — Ambulatory Visit (HOSPITAL_COMMUNITY): Payer: Commercial Managed Care - PPO | Attending: Pediatrics

## 2012-09-23 DIAGNOSIS — O99840 Bariatric surgery status complicating pregnancy, unspecified trimester: Secondary | ICD-10-CM | POA: Insufficient documentation

## 2012-09-23 MED ORDER — FERUMOXYTOL INJECTION 510 MG/17 ML
510.0000 mg | INTRAVENOUS | Status: AC
  Administered 2012-09-23: 510 mg via INTRAVENOUS
  Filled 2012-09-23: qty 17

## 2012-09-23 NOTE — MAU Note (Signed)
Pt here for IV iron infusion 

## 2012-09-26 ENCOUNTER — Other Ambulatory Visit: Payer: Self-pay | Admitting: Obstetrics

## 2012-09-29 ENCOUNTER — Encounter (HOSPITAL_COMMUNITY): Payer: Self-pay

## 2012-09-29 ENCOUNTER — Inpatient Hospital Stay (HOSPITAL_COMMUNITY)
Admission: AD | Admit: 2012-09-29 | Discharge: 2012-09-29 | Disposition: A | Discharge status: Home / Self Care | Payer: Commercial Managed Care - PPO | Source: Ambulatory Visit | Attending: Obstetrics | Admitting: Obstetrics

## 2012-09-29 DIAGNOSIS — O09529 Supervision of elderly multigravida, unspecified trimester: Secondary | ICD-10-CM | POA: Insufficient documentation

## 2012-09-29 DIAGNOSIS — O99840 Bariatric surgery status complicating pregnancy, unspecified trimester: Secondary | ICD-10-CM | POA: Insufficient documentation

## 2012-09-29 MED ORDER — FERUMOXYTOL INJECTION 510 MG/17 ML
510.0000 mg | Freq: Once | INTRAVENOUS | Status: AC
  Administered 2012-09-29: 510 mg via INTRAVENOUS
  Filled 2012-09-29: qty 17

## 2012-09-29 NOTE — MAU Note (Signed)
No adverse reaction to iron infusion.

## 2012-09-29 NOTE — MAU Note (Signed)
No complaints offered, feeling good.

## 2012-09-29 NOTE — MAU Note (Signed)
Pt here for iv iron infusion only, denies pain or bleeding.

## 2012-10-24 LAB — OB RESULTS CONSOLE GBS: GBS: NEGATIVE

## 2012-11-15 ENCOUNTER — Encounter (HOSPITAL_COMMUNITY): Payer: Self-pay | Admitting: *Deleted

## 2012-11-15 ENCOUNTER — Inpatient Hospital Stay (HOSPITAL_COMMUNITY)
Admission: AD | Admit: 2012-11-15 | Discharge: 2012-11-15 | Disposition: A | Discharge status: Home / Self Care | Payer: Commercial Managed Care - PPO | Source: Ambulatory Visit | Attending: Obstetrics and Gynecology | Admitting: Obstetrics and Gynecology

## 2012-11-15 DIAGNOSIS — R109 Unspecified abdominal pain: Secondary | ICD-10-CM | POA: Insufficient documentation

## 2012-11-15 DIAGNOSIS — M545 Low back pain, unspecified: Secondary | ICD-10-CM | POA: Insufficient documentation

## 2012-11-15 DIAGNOSIS — O99891 Other specified diseases and conditions complicating pregnancy: Secondary | ICD-10-CM | POA: Insufficient documentation

## 2012-11-15 NOTE — MAU Provider Note (Signed)
History     Chief Complaint  Patient presents with  . Back Pain  . Abdominal Pain  37 yo G1P0 hispanic female @ 66 1/[redacted] weeks gestation presents for evaluation of back pain and pelvic pressure. Pt was seen earlier in the office for same complaint. Pt c/o back pain which is constant and with no comfortable position to lie or sit. Also notes lower abd pressure (+) FM. ucx was neg per pt. (-) vaginal bleeding (+) FM pt has not used heat or tylenol for pain   OB History   Grav Para Term Preterm Abortions TAB SAB Ect Mult Living   1               Past Medical History  Diagnosis Date  . Vitamin D deficiency   . Allergy     seasonal  . Low blood sugar   . UTI (urinary tract infection)   . Fibromyalgia   . Refusal of blood transfusions as patient is Jehovah's Witness 09/10/11  . Low blood sugar     Past Surgical History  Procedure Laterality Date  . Lasik  2004, 2010    bilateral  . Laparoscopic gastric bypass  05/09/10    [place]  . Cholecystectomy  09/10/11  . Cholecystectomy  09/10/2011    Procedure: LAPAROSCOPIC CHOLECYSTECTOMY WITH INTRAOPERATIVE CHOLANGIOGRAM;  Surgeon: Atilano Ina, MD,FACS;  Location: [facility] OR;  Service: General;  Laterality: N/A;  . Esophagogastroduodenoscopy  11/09/2011    Procedure: ESOPHAGOGASTRODUODENOSCOPY (EGD);  Surgeon: Atilano Ina, MD,FACS;  Location: [facility];  Service: General;  Laterality: N/A;    Family History  Problem Relation Age of Onset  . Hyperlipidemia Mother     History  Substance Use Topics  . Smoking status: Never Smoker   . Smokeless tobacco: Never Used  . Alcohol Use: Yes     Comment: 09/10/11 "glass of wine maybe twice/month"    Allergies: No Known Allergies  Prescriptions prior to admission  Medication Sig Dispense Refill  . Calcium Carbonate-Vitamin D (OSCAL 500/200 D-3 PO) Take 1 tablet by mouth daily.      . Fe Fum-FePoly-Vit C-Vit B3 (INTEGRA PO) Take 1 tablet by mouth daily.      . fluticasone (FLONASE) 50  MCG/ACT nasal spray Place 2 sprays into the nose as needed. Nasal allergies      . folic acid (FOLVITE) 400 MCG tablet Take 400 mcg by mouth daily.      . Loratadine (CLARITIN) 10 MG CAPS Take 10 mg by mouth.      . nitrofurantoin (MACRODANTIN) 100 MG capsule Take 100 mg by mouth 4 (four) times daily.      . Prenatal Vit-Fe Fumarate-FA (PRENATAL MULTIVITAMIN) TABS Take 1 tablet by mouth daily at 12 noon.         Physical Exam   Blood pressure 111/64, pulse 64, temperature 97.9 F (36.6 C), temperature source Oral, resp. rate 19, height 5\' 5"  (1.651 m), weight 84.278 kg (185 lb 12.8 oz), last menstrual period 02/22/2012, SpO2 100.00%.   WDWN gravid female lying on side. Back: no CVAT. Location of pain upper mid back Abd gravid soft nontender Pelvic: flared(1cm/70%/-3  Tracing: baseline 130 (-) ctx  ED Course  Musculoskeletal pain w/ poss muscle spasm IUP @ 38 1/7 weeks P)offered flexeril/vicodin. Both were declined by pt. Await reactive NST no evidence of labor. D/c home. Keep OB appt. Use warm heat, tylenol  MDM  COUSINS,SHERONETTE A, MD 6:59 PM 11/15/2012

## 2012-11-15 NOTE — MAU Note (Signed)
Constant pain in low back, pelvic pressure, cramping in the low abd.

## 2012-11-15 NOTE — MAU Note (Signed)
Patient Leslie Bray is [redacted]w[redacted]d; presents today with c/o lower back pain, pelvic pressure, and cramping that has been going on since yesterday. Was seen in the office today and states was 1 cm 70%. States urine test at office came back negative for uti and was advise to come to hospital since not feeling better.

## 2012-11-18 ENCOUNTER — Inpatient Hospital Stay (HOSPITAL_COMMUNITY)
Admission: AD | Admit: 2012-11-18 | Discharge: 2012-11-20 | DRG: 775 | Disposition: A | Discharge status: Home / Self Care | Payer: Commercial Managed Care - PPO | Source: Ambulatory Visit | Attending: Obstetrics & Gynecology | Admitting: Obstetrics & Gynecology

## 2012-11-18 ENCOUNTER — Encounter (HOSPITAL_COMMUNITY): Payer: Self-pay | Admitting: *Deleted

## 2012-11-18 DIAGNOSIS — O9902 Anemia complicating childbirth: Secondary | ICD-10-CM | POA: Diagnosis present

## 2012-11-18 DIAGNOSIS — O09519 Supervision of elderly primigravida, unspecified trimester: Secondary | ICD-10-CM | POA: Diagnosis present

## 2012-11-18 DIAGNOSIS — D649 Anemia, unspecified: Secondary | ICD-10-CM | POA: Diagnosis present

## 2012-11-18 DIAGNOSIS — O99844 Bariatric surgery status complicating childbirth: Secondary | ICD-10-CM | POA: Diagnosis present

## 2012-11-18 HISTORY — DX: Other specified postprocedural states: Z98.890

## 2012-11-18 HISTORY — DX: Bariatric surgery status: Z98.84

## 2012-11-18 HISTORY — DX: Calculus of gallbladder and bile duct with cholecystitis, unspecified, with obstruction: K80.61

## 2012-11-18 LAB — CBC
HCT: 37.5 % (ref 36.0–46.0)
Hemoglobin: 12.8 g/dL (ref 12.0–15.0)
MCH: 27.7 pg (ref 26.0–34.0)
MCHC: 34.1 g/dL (ref 30.0–36.0)
MCV: 81.2 fL (ref 78.0–100.0)
Platelets: 262 10*3/uL (ref 150–400)
RBC: 4.62 MIL/uL (ref 3.87–5.11)
WBC: 10.8 10*3/uL — ABNORMAL HIGH (ref 4.0–10.5)

## 2012-11-18 MED ORDER — OXYTOCIN BOLUS FROM INFUSION: 500.0000 mL | INTRAVENOUS | Status: DC

## 2012-11-18 MED ORDER — OXYCODONE-ACETAMINOPHEN 5-325 MG PO TABS
1.0000 | ORAL_TABLET | ORAL | Status: DC | PRN
Start: 2012-11-18 — End: 2012-11-19

## 2012-11-18 MED ORDER — IBUPROFEN 600 MG PO TABS: 600.0000 mg | ORAL_TABLET | Freq: Four times a day (QID) | ORAL | Status: DC | PRN

## 2012-11-18 MED ORDER — OXYTOCIN 40 UNITS IN LACTATED RINGERS INFUSION - SIMPLE MED
1.0000 m[IU]/min | INTRAVENOUS | Status: DC
  Filled 2012-11-18: qty 1000

## 2012-11-18 MED ORDER — ONDANSETRON HCL 4 MG/2ML IJ SOLN: 4.0000 mg | Freq: Four times a day (QID) | INTRAMUSCULAR | Status: DC | PRN

## 2012-11-18 MED ORDER — LACTATED RINGERS IV SOLN
INTRAVENOUS | Status: DC
  Administered 2012-11-18 – 2012-11-19 (×2): via INTRAVENOUS

## 2012-11-18 MED ORDER — LACTATED RINGERS IV SOLN: 500.0000 mL | INTRAVENOUS | Status: DC | PRN

## 2012-11-18 MED ORDER — OXYTOCIN 40 UNITS IN LACTATED RINGERS INFUSION - SIMPLE MED
62.5000 mL/h | INTRAVENOUS | Status: DC
  Administered 2012-11-19: 999 mL/h via INTRAVENOUS

## 2012-11-18 MED ORDER — TERBUTALINE SULFATE 1 MG/ML IJ SOLN: 0.2500 mg | Freq: Once | INTRAMUSCULAR | Status: AC | PRN

## 2012-11-18 MED ORDER — LIDOCAINE HCL (PF) 1 % IJ SOLN: 30.0000 mL | INTRAMUSCULAR | Status: DC | PRN

## 2012-11-18 MED ORDER — OXYTOCIN 40 UNITS IN LACTATED RINGERS INFUSION - SIMPLE MED: 1.0000 m[IU]/min | INTRAVENOUS | Status: DC

## 2012-11-18 MED ORDER — ACETAMINOPHEN 325 MG PO TABS: 650.0000 mg | ORAL_TABLET | ORAL | Status: DC | PRN

## 2012-11-18 MED ORDER — CITRIC ACID-SODIUM CITRATE 334-500 MG/5ML PO SOLN: 30.0000 mL | ORAL | Status: DC | PRN

## 2012-11-18 NOTE — H&P (Signed)
Leslie Bray is a 37 y.o. female G1 at 38.4 wks, presenting with ruptured membranes, clear fluid at 6 pm. Feels some contraction. No bleeding. Good fetal movements.   PNCare with [facility] from 21 wks, after transferring from another practice. Ob care complicated by Pyelonephritis in early pregnancy, was on Macrobid suppression and recent UTI treatment with Keflex.  Medical hx- Gastric Bypass, cholecystectomy. Anemia (IV iron in preg), Jehovah's witness.  Cannot tolerated glucose load, so Glucola was deferred and was checking F, PP glucose, all nl.  Other labs nl, GBS (-).     History OB History   Grav Para Term Preterm Abortions TAB SAB Ect Mult Living   1              Past Medical History  Diagnosis Date  . Vitamin D deficiency   . Allergy     seasonal  . Low blood sugar   . UTI (urinary tract infection)   . Fibromyalgia   . Refusal of blood transfusions as patient is Jehovah's Witness 09/10/11  . Low blood sugar   . Gastric bypass status for obesity 04/2010  . Gallbladder and bile duct calculus with cholecystitis with obstruction 08/2011  . Hx of LASIK 04/2002  . Hx of LASIK 10/2008   Past Surgical History  Procedure Laterality Date  . Lasik  2004, 2010    bilateral  . Laparoscopic gastric bypass  05/09/10    [place]  . Cholecystectomy  09/10/11  . Cholecystectomy  09/10/2011    Procedure: LAPAROSCOPIC CHOLECYSTECTOMY WITH INTRAOPERATIVE CHOLANGIOGRAM;  Surgeon: Atilano Ina, MD,FACS;  Location: [facility] OR;  Service: General;  Laterality: N/A;  . Esophagogastroduodenoscopy  11/09/2011    Procedure: ESOPHAGOGASTRODUODENOSCOPY (EGD);  Surgeon: Atilano Ina, MD,FACS;  Location: [facility];  Service: General;  Laterality: N/A;   Family History: family history includes Hyperlipidemia in her mother. Social History:  reports that she has never smoked. She has never used smokeless tobacco. She reports that  drinks alcohol. She reports that she does not use illicit  drugs.   Prenatal Transfer Tool  Maternal Diabetes: No Genetic Screening: Normal Ultrascreen neg, NT nl, AFP1 nl Maternal Ultrasounds/Referrals: Normal Fetal Ultrasounds or other Referrals:  None Maternal Substance Abuse:  No Significant Maternal Medications:  Meds include: Other:  Macrobid daily suppression, recent Keflex for UTI Significant Maternal Lab Results:  Lab values include: Group B Strep negative Other Comments:  Jehovah's witness, no blood or products  ROS  neg  Dilation: 2 Effacement (%): 80 Station: -2 Exam by:: L.mears,RN Blood pressure 115/80, pulse 75, temperature 98.6 F (37 C), temperature source Oral, resp. rate 18, height 5\' 6"  (1.676 m), weight 186 lb 6.4 oz (84.55 kg), last menstrual period 02/22/2012. Exam Physical Exam   A&O x 3, no acute distress. Pleasant HEENT neg, no thyromegaly Lungs CTA bilat CV RRR, S1S2 normal Abdo soft, non tender, non acute Extr no edema/ tenderness Pelvic as above per RN FHT  140s/ + accels/ no decels/ moderate variab- reassuring- category I Toco 2-4 min, spontaneous  Prenatal labs: ABO, Rh: A/Positive/-- (04/03 0000) Antibody: Negative (04/03 0000) Rubella: Immune (04/03 0000) RPR: Nonreactive (04/03 0000)  HBsAg: Negative (04/03 0000)  HIV: Non-reactive (04/03 0000)  GBS: Negative (07/08 0000)   Assessment/Plan: 37 yo, G1 at 38.4 wks, SROM 6 pm, early labor, regular contractions, GBS(-) Assess over next 4-6 hrs if labor does progress or UCs space out will start pitocin EFW 7 lbs. FHT cat I WILL NOT ACCEPT  ANY BLOOD/ Products- Jehovah's Witness    MODY,VAISHALI R 11/18/2012, 9:10 PM

## 2012-11-18 NOTE — MAU Note (Signed)
Leaked fld couple times about 1800. When wiped x 1 saw blood on tissue. Think I'm contracting some. Good FM

## 2012-11-19 ENCOUNTER — Encounter (HOSPITAL_COMMUNITY): Payer: Self-pay | Admitting: Anesthesiology

## 2012-11-19 ENCOUNTER — Inpatient Hospital Stay (HOSPITAL_COMMUNITY): Payer: Commercial Managed Care - PPO | Admitting: Anesthesiology

## 2012-11-19 LAB — TYPE AND SCREEN
ABO/RH(D): A POS
Antibody Screen: NEGATIVE

## 2012-11-19 LAB — ABO/RH: ABO/RH(D): A POS

## 2012-11-19 LAB — RPR: RPR Ser Ql: NONREACTIVE

## 2012-11-19 MED ORDER — LACTATED RINGERS IV SOLN: 500.0000 mL | Freq: Once | INTRAVENOUS | Status: DC

## 2012-11-19 MED ORDER — PHENYLEPHRINE 40 MCG/ML (10ML) SYRINGE FOR IV PUSH (FOR BLOOD PRESSURE SUPPORT)
80.0000 ug | PREFILLED_SYRINGE | INTRAVENOUS | Status: DC | PRN
  Filled 2012-11-19: qty 5

## 2012-11-19 MED ORDER — NITROFURANTOIN MACROCRYSTAL 50 MG PO CAPS
50.0000 mg | ORAL_CAPSULE | Freq: Every day | ORAL | Status: DC
  Administered 2012-11-19: 50 mg via ORAL
  Filled 2012-11-19: qty 1

## 2012-11-19 MED ORDER — PRENATAL MULTIVITAMIN CH
1.0000 | ORAL_TABLET | Freq: Every day | ORAL | Status: DC
Start: 2012-11-19 — End: 2012-11-20
  Administered 2012-11-19 – 2012-11-20 (×2): 1 via ORAL
  Filled 2012-11-19 (×2): qty 1

## 2012-11-19 MED ORDER — SIMETHICONE 80 MG PO CHEW: 80.0000 mg | CHEWABLE_TABLET | ORAL | Status: DC | PRN

## 2012-11-19 MED ORDER — LANOLIN HYDROUS EX OINT: TOPICAL_OINTMENT | CUTANEOUS | Status: DC | PRN

## 2012-11-19 MED ORDER — TETANUS-DIPHTH-ACELL PERTUSSIS 5-2.5-18.5 LF-MCG/0.5 IM SUSP: 0.5000 mL | Freq: Once | INTRAMUSCULAR | Status: DC

## 2012-11-19 MED ORDER — ZOLPIDEM TARTRATE 5 MG PO TABS: 5.0000 mg | ORAL_TABLET | Freq: Every evening | ORAL | Status: DC | PRN

## 2012-11-19 MED ORDER — ONDANSETRON HCL 4 MG/2ML IJ SOLN
4.0000 mg | INTRAMUSCULAR | Status: DC | PRN
  Administered 2012-11-19: 4 mg via INTRAVENOUS
  Filled 2012-11-19: qty 2

## 2012-11-19 MED ORDER — FENTANYL 2.5 MCG/ML BUPIVACAINE 1/10 % EPIDURAL INFUSION (WH - ANES)
14.0000 mL/h | INTRAMUSCULAR | Status: DC | PRN
  Filled 2012-11-19: qty 125

## 2012-11-19 MED ORDER — DIPHENHYDRAMINE HCL 50 MG/ML IJ SOLN: 12.5000 mg | INTRAMUSCULAR | Status: DC | PRN

## 2012-11-19 MED ORDER — PHENYLEPHRINE 40 MCG/ML (10ML) SYRINGE FOR IV PUSH (FOR BLOOD PRESSURE SUPPORT): 80.0000 ug | PREFILLED_SYRINGE | INTRAVENOUS | Status: DC | PRN

## 2012-11-19 MED ORDER — BENZOCAINE-MENTHOL 20-0.5 % EX AERO
1.0000 "application " | INHALATION_SPRAY | CUTANEOUS | Status: DC | PRN
  Administered 2012-11-19: 1 via TOPICAL
  Filled 2012-11-19 (×2): qty 56

## 2012-11-19 MED ORDER — LIDOCAINE HCL (PF) 1 % IJ SOLN
INTRAMUSCULAR | Status: DC | PRN
  Administered 2012-11-19: 9 mL
  Administered 2012-11-19: 8 mL

## 2012-11-19 MED ORDER — OXYCODONE-ACETAMINOPHEN 5-325 MG PO TABS: 1.0000 | ORAL_TABLET | ORAL | Status: DC | PRN

## 2012-11-19 MED ORDER — DIBUCAINE 1 % RE OINT
1.0000 "application " | TOPICAL_OINTMENT | RECTAL | Status: DC | PRN
  Filled 2012-11-19: qty 28

## 2012-11-19 MED ORDER — ONDANSETRON HCL 4 MG PO TABS: 4.0000 mg | ORAL_TABLET | ORAL | Status: DC | PRN

## 2012-11-19 MED ORDER — IBUPROFEN 600 MG PO TABS
600.0000 mg | ORAL_TABLET | Freq: Four times a day (QID) | ORAL | Status: DC
  Administered 2012-11-19 – 2012-11-20 (×4): 600 mg via ORAL
  Filled 2012-11-19 (×4): qty 1

## 2012-11-19 MED ORDER — FENTANYL 2.5 MCG/ML BUPIVACAINE 1/10 % EPIDURAL INFUSION (WH - ANES)
INTRAMUSCULAR | Status: DC | PRN
  Administered 2012-11-19: 14 mL/h via EPIDURAL

## 2012-11-19 MED ORDER — WITCH HAZEL-GLYCERIN EX PADS: 1.0000 "application " | MEDICATED_PAD | CUTANEOUS | Status: DC | PRN

## 2012-11-19 MED ORDER — EPHEDRINE 5 MG/ML INJ: 10.0000 mg | INTRAVENOUS | Status: DC | PRN

## 2012-11-19 MED ORDER — DIPHENHYDRAMINE HCL 25 MG PO CAPS: 25.0000 mg | ORAL_CAPSULE | Freq: Four times a day (QID) | ORAL | Status: DC | PRN

## 2012-11-19 MED ORDER — EPHEDRINE 5 MG/ML INJ
10.0000 mg | INTRAVENOUS | Status: DC | PRN
  Filled 2012-11-19: qty 4

## 2012-11-19 MED ORDER — SENNOSIDES-DOCUSATE SODIUM 8.6-50 MG PO TABS
2.0000 | ORAL_TABLET | Freq: Every day | ORAL | Status: DC
  Administered 2012-11-19: 2 via ORAL

## 2012-11-19 NOTE — Anesthesia Procedure Notes (Signed)
Epidural Patient location during procedure: OB Start time: 11/19/2012 3:16 AM End time: 11/19/2012 3:20 AM  Staffing Anesthesiologist: Sandrea Hughs Performed by: anesthesiologist   Preanesthetic Checklist Completed: patient identified, surgical consent, pre-op evaluation, timeout performed, IV checked, risks and benefits discussed and monitors and equipment checked  Epidural Patient position: sitting Prep: site prepped and draped and DuraPrep Patient monitoring: continuous pulse ox and blood pressure Approach: right paramedian Injection technique: LOR air  Needle:  Needle type: Tuohy  Needle gauge: 17 G Needle length: 9 cm and 9 Needle insertion depth: 6 cm Catheter type: closed end flexible Catheter size: 19 Gauge Catheter at skin depth: 11 cm Test dose: negative and Other  Assessment Sensory level: T9 Events: blood not aspirated, injection not painful, no injection resistance, negative IV test and no paresthesia  Additional Notes Reason for block:procedure for pain

## 2012-11-19 NOTE — Anesthesia Preprocedure Evaluation (Signed)
Anesthesia Evaluation  Patient identified by MRN, date of birth, ID band Patient awake    Reviewed: Allergy & Precautions, H&P , NPO status , Patient's Chart, lab work & pertinent test results  Airway Mallampati: II TM Distance: >3 FB Neck ROM: full    Dental no notable dental hx.    Pulmonary neg pulmonary ROS,    Pulmonary exam normal       Cardiovascular negative cardio ROS      Neuro/Psych negative psych ROS   GI/Hepatic negative GI ROS, Neg liver ROS,   Endo/Other  negative endocrine ROS  Renal/GU negative Renal ROS     Musculoskeletal   Abdominal Normal abdominal exam  (+)   Peds  Hematology negative hematology ROS (+)   Anesthesia Other Findings   Reproductive/Obstetrics (+) Pregnancy                           Anesthesia Physical Anesthesia Plan  ASA: II  Anesthesia Plan: Epidural   Post-op Pain Management:    Induction:   Airway Management Planned:   Additional Equipment:   Intra-op Plan:   Post-operative Plan:   Informed Consent: I have reviewed the patients History and Physical, chart, labs and discussed the procedure including the risks, benefits and alternatives for the proposed anesthesia with the patient or authorized representative who has indicated his/her understanding and acceptance.     Plan Discussed with:   Anesthesia Plan Comments:         Anesthesia Quick Evaluation  

## 2012-11-19 NOTE — Anesthesia Postprocedure Evaluation (Signed)
Anesthesia Post Note  Patient: Leslie Bray  Procedure(s) Performed: * No procedures listed *  Anesthesia type: Epidural  Patient location: Mother/Baby  Post pain: Pain level controlled  Post assessment: Post-op Vital signs reviewed  Last Vitals:  Filed Vitals:   11/19/12 1150  BP: 116/73  Pulse: 84  Temp: 36.7 C  Resp: 18    Post vital signs: Reviewed  Level of consciousness:alert  Complications: No apparent anesthesia complications

## 2012-11-20 LAB — CBC
HCT: 33.2 % — ABNORMAL LOW (ref 36.0–46.0)
Hemoglobin: 11.2 g/dL — ABNORMAL LOW (ref 12.0–15.0)
MCH: 27.8 pg (ref 26.0–34.0)
MCHC: 33.7 g/dL (ref 30.0–36.0)
MCV: 82.4 fL (ref 78.0–100.0)
Platelets: 207 10*3/uL (ref 150–400)
RBC: 4.03 MIL/uL (ref 3.87–5.11)
WBC: 14.7 10*3/uL — ABNORMAL HIGH (ref 4.0–10.5)

## 2012-11-20 MED ORDER — NITROFURANTOIN MONOHYD MACRO 100 MG PO CAPS: 100.0000 mg | ORAL_CAPSULE | Freq: Every morning | ORAL | Status: DC

## 2012-11-20 MED ORDER — CALCIUM CARBONATE-VITAMIN D 500-200 MG-UNIT PO TABS: 1.0000 | ORAL_TABLET | Freq: Every day | ORAL | Status: DC

## 2012-11-20 MED ORDER — IBUPROFEN 600 MG PO TABS: 600.0000 mg | ORAL_TABLET | Freq: Four times a day (QID) | ORAL | Status: DC

## 2012-11-20 NOTE — Discharge Summary (Signed)
Obstetric Discharge Summary Reason for Admission: onset of labor and rupture of membranes Prenatal Procedures: none Intrapartum Procedures: spontaneous vaginal delivery Postpartum Procedures: none Complications-Operative and Postpartum: 2nd degree perineal laceration Hemoglobin  Date Value Range Status  11/20/2012 11.2* 12.0 - 15.0 g/dL Final     HCT  Date Value Range Status  11/20/2012 33.2* 36.0 - 46.0 % Final    Physical Exam:  General: alert and cooperative Lochia: appropriate Uterine Fundus: firm Incision: healing well, no significant drainage, no dehiscence, no significant erythema DVT Evaluation: No evidence of DVT seen on physical exam. Negative Homan's sign. No cords or calf tenderness. No significant calf/ankle edema.  Discharge Diagnoses: Term Pregnancy-delivered  Discharge Information: Date: 11/20/2012 Activity: pelvic rest Diet: routine Medications: PNV, Ibuprofen and Oscal, Flonase, Claritin Condition: stable Instructions: refer to practice specific booklet Discharge to: home Follow-up Information   Follow up with [facility] A[facility] MD. Schedule an appointment as soon as possible for a visit in 6 weeks.   Contact information:   [address] [place] [address] [phone]       Newborn Data: Live born female on 11/19/2012 Birth Weight: 6 lb 14.1 oz (3120 g) APGAR: 9, 9  Home with mother.  BHAMBRI, MELANIE, N 11/20/2012, 12:01 PM

## 2012-11-20 NOTE — Progress Notes (Signed)
PPD #1- SVD  Subjective:   Reports feeling well Tolerating po/ No nausea or vomiting Bleeding is light Pain controlled with Motrin Up ad lib / ambulatory / voiding without problems Newborn: breastfeeding  Objective:   ZO:XWRU:  [97.7 F (36.5 C)-99 F (37.2 C)] 97.7 F (36.5 C) (08/04 0610) Pulse Rate:  [60-106] 66 (08/04 0610) Resp:  [16-18] 18 (08/04 0610) BP: (96-120)/(60-79) 98/65 mmHg (08/04 0610) SpO2:  [97 %-98 %] 98 % (08/04 0130)  LABS:  Recent Labs  11/18/12 2020 11/20/12 0547  WBC 10.8* 14.7*  HGB 12.8 11.2*  PLT 262 207   Blood type: --/--/A POS, A POS (08/02 2020) Rubella: Immune (04/03 0000)   I&O: Intake/Output     08/03 0701 - 08/04 0700 08/04 0701 - 08/05 0700   Urine (mL/kg/hr) 1000 (0.5)    Total Output 1000     Net -1000          Urine Occurrence 1 x      Physical Exam: Alert and oriented x3 Abdomen: soft, non-tender, non-distended  Fundus: firm, non-tender, U-1 Perineum: Well approximated, no significant erythema, edema, or drainage; healing well. Lochia: moderate Extremities: negative edema, no calf pain or tenderness    Assessment:  PPD # [id number]/ S/P:SVD, 2nd degree laceration  Doing well    Plan: Continue routine post partum orders Possible discharge home later today  Donette Larry, N MSN, CNM 11/20/2012, 9:49 AM

## 2012-12-13 ENCOUNTER — Encounter (INDEPENDENT_AMBULATORY_CARE_PROVIDER_SITE_OTHER): Payer: Self-pay | Admitting: General Surgery

## 2012-12-13 ENCOUNTER — Other Ambulatory Visit (INDEPENDENT_AMBULATORY_CARE_PROVIDER_SITE_OTHER): Payer: Self-pay | Admitting: General Surgery

## 2012-12-13 ENCOUNTER — Ambulatory Visit (INDEPENDENT_AMBULATORY_CARE_PROVIDER_SITE_OTHER): Payer: Commercial Managed Care - PPO | Admitting: General Surgery

## 2012-12-13 DIAGNOSIS — Z9884 Bariatric surgery status: Secondary | ICD-10-CM

## 2012-12-13 LAB — CMP AND LIVER
ALT: 44 U/L — ABNORMAL HIGH (ref 0–35)
AST: 49 U/L — ABNORMAL HIGH (ref 0–37)
Albumin: 4.2 g/dL (ref 3.5–5.2)
Alkaline Phosphatase: 67 U/L (ref 39–117)
BUN: 12 mg/dL (ref 6–23)
Bilirubin, Direct: 0.1 mg/dL (ref 0.0–0.3)
CO2: 26 mEq/L (ref 19–32)
Calcium: 9.6 mg/dL (ref 8.4–10.5)
Chloride: 106 mEq/L (ref 96–112)
Creat: 0.59 mg/dL (ref 0.50–1.10)
Glucose, Bld: 74 mg/dL (ref 70–99)
Indirect Bilirubin: 0.3 mg/dL (ref 0.0–0.9)
Potassium: 4.2 mEq/L (ref 3.5–5.3)
Sodium: 141 mEq/L (ref 135–145)
Total Bilirubin: 0.4 mg/dL (ref 0.3–1.2)
Total Protein: 7 g/dL (ref 6.0–8.3)

## 2012-12-13 LAB — CBC WITH DIFFERENTIAL/PLATELET
Basophils Absolute: 0 10*3/uL (ref 0.0–0.1)
Basophils Relative: 1 % (ref 0–1)
Eosinophils Absolute: 0.1 10*3/uL (ref 0.0–0.7)
Eosinophils Relative: 2 % (ref 0–5)
HCT: 41.1 % (ref 36.0–46.0)
Hemoglobin: 13.2 g/dL (ref 12.0–15.0)
Lymphocytes Relative: 28 % (ref 12–46)
Lymphs Abs: 1.7 10*3/uL (ref 0.7–4.0)
MCH: 28 pg (ref 26.0–34.0)
MCHC: 32.1 g/dL (ref 30.0–36.0)
MCV: 87.3 fL (ref 78.0–100.0)
Monocytes Absolute: 0.4 10*3/uL (ref 0.1–1.0)
Monocytes Relative: 7 % (ref 3–12)
Neutro Abs: 3.7 10*3/uL (ref 1.7–7.7)
Neutrophils Relative %: 62 % (ref 43–77)
Platelets: 254 10*3/uL (ref 150–400)
RBC: 4.71 MIL/uL (ref 3.87–5.11)
RDW: 19.3 % — ABNORMAL HIGH (ref 11.5–15.5)
WBC: 5.9 10*3/uL (ref 4.0–10.5)

## 2012-12-13 LAB — LIPID PANEL
Cholesterol: 178 mg/dL (ref 0–200)
HDL: 58 mg/dL (ref >39)
LDL Cholesterol: 101 mg/dL — ABNORMAL HIGH (ref 0–99)
Total CHOL/HDL Ratio: 3.1 Ratio
Triglycerides: 94 mg/dL (ref <150)
VLDL: 19 mg/dL (ref 0–40)

## 2012-12-13 LAB — MAGNESIUM: Magnesium: 2.1 mg/dL (ref 1.5–2.5)

## 2012-12-13 LAB — IBC PANEL
%SAT: 19 % — ABNORMAL LOW (ref 20–55)
TIBC: 500 ug/dL — ABNORMAL HIGH (ref 250–470)
UIBC: 403 ug/dL — ABNORMAL HIGH (ref 125–400)

## 2012-12-13 LAB — FOLATE: Folate: >20 ng/mL

## 2012-12-13 LAB — IRON: Iron: 97 ug/dL (ref 42–145)

## 2012-12-13 LAB — VITAMIN B12: Vitamin B-12: 381 pg/mL (ref 211–911)

## 2012-12-13 MED ORDER — SUCRALFATE 1 GM/10ML PO SUSP: 1.0000 g | Freq: Four times a day (QID) | ORAL | Status: DC

## 2012-12-13 MED ORDER — PANTOPRAZOLE SODIUM 40 MG PO TBEC: 40.0000 mg | DELAYED_RELEASE_TABLET | Freq: Every day | ORAL | Status: DC

## 2012-12-13 NOTE — Patient Instructions (Addendum)
Continue taking supplements Get your labs checked Pick up prescriptions for anti-ulcer medication and carrafate Call if symptoms worsen or persist Stop taking motrin Discuss protonix with your OB/GYN

## 2012-12-14 LAB — VITAMIN D 25 HYDROXY (VIT D DEFICIENCY, FRACTURES): Vit D, 25-Hydroxy: 39 ng/mL (ref 30–89)

## 2012-12-14 NOTE — Progress Notes (Signed)
Subjective:     Patient ID: Leslie Bray, female   DOB: 10-Oct-1975, 37 y.o.   MRN: [id number]  HPI 37 year old Hispanic female comes in for annual followup. She underwent laparoscopic Roux-en-Y gastric bypass surgery in [place] in 2012. I initially met her in 2013. She underwent a cholecystectomy in May 2013. She did develop some left upper quadrant pain which ultimately ended up requiring an upper endoscopy in July of 2013 and there is no evidence of ulcer. Her abdominal pain had resolved. Since she was last seen she is delivered a lovely baby girl about 1-1/2 months ago. She did develop iron deficiency anemia requiring iron infusions. She states that she is compliant with her supplements. She denies any paresthesias. She denies any hair loss. She denies any diarrhea or constipation. She denies any dumping. She states that her left upper quadrant pain which is primarily underneath her rib cage returned toward the end of her pregnancy. She describes it as a sharp pain radiating to her back. She states that the pain generally goes away when she eats something. The pain will generally last 3 minutes. She generally has symptoms about 3 times a week. She also has had some burping. She denies any nausea or bloating. She did have a flare just after leaving the hospital after delivery of her baby. She was taking some ibuprofen for 2-3 days on discharge from the hospital. She is currently not taking a PPI.   PMHx, PSHx, SOCHx, FAMHx, ALL reviewed    Review of Systems 12 point review of systems is performed and all systems are negative except for what is mentioned above    Objective:   Physical Exam BP 106/74  Pulse 66  Temp(Src) 97.9 F (36.6 C) (Temporal)  Resp 14  Ht 5\' 6"  (1.676 m)  Wt 172 lb (78.019 kg)  BMI 27.77 kg/m2  SpO2 98%  Gen: alert, NAD, non-toxic appearing Pupils: equal, no scleral icterus Pulm: Lungs clear to auscultation, symmetric chest rise CV: regular rate and  rhythm Abd: soft, nontender, nondistended. Well-healed trocar sites. No cellulitis. No incisional hernia Ext: no edema, no calf tenderness Skin: no rash, no jaundice     Assessment:     Status post laparoscopic Roux-en-Y gastric bypass in [place] in 2012 Left upper quadrant pain     Plan:     She is not ill-appearing. My best guess is that she has developed a mild ulcer. She was given a prescription for Protonix. She was also given a prescription for Carafate. She was advised to avoid all NSAIDs. She is also due for lab surveillance. She was also advised to contact her OB/GYN about her taking that for comments with her trying to breast-feed. She was instructed to stay on the Protonix for at least 3 months. She was instructed to call us if her symptoms did not improve Otherwise followup in one year  Mary Sella. Andrey Campanile, MD, FACS General, Bariatric, & Minimally Invasive Surgery [facility], [place]

## 2013-04-19 NOTE — L&D Delivery Note (Signed)
Delivery Note At 3:22 PM a viable female was delivered via Vaginal, Spontaneous Delivery (Presentation: Right Occiput Anterior).  APGAR: 7, 9; weight  . pending  Placenta status: Intact, Spontaneous.  Cord: 3 vessels with the following complications: None.  Cord pH: not indicated  Easy delivery of head, tight nuchal noted, clamped and cut, shoulders immediately followed  Anesthesia: Epidural  Episiotomy: None Lacerations: 2nd degree Suture Repair: 3.0 vicryl rapide Est. Blood Loss (mL): 400  Mom to postpartum.  Baby to Couplet care / Skin to Skin.  FOGLEMAN,KELLY A. 04/03/2014, 3:51 PM

## 2013-08-04 IMAGING — RF DG CHOLANGIOGRAM OPERATIVE
1 series · 6 of 6 positions shown · non-contrast
Comparison: None.

CLINICAL DATA: Chronic cholecystitis.

INTRAOPERATIVE CHOLANGIOGRAM
TECHNIQUE: Cholangiographic images from the C-arm fluoroscopic
device were submitted for interpretation post-operatively.  Please
see the procedural report for the amount of contrast and the
fluoroscopy time utilized.

[Series 1: run · 3 acquisitions, 6 frames shown]
[im 1/3]
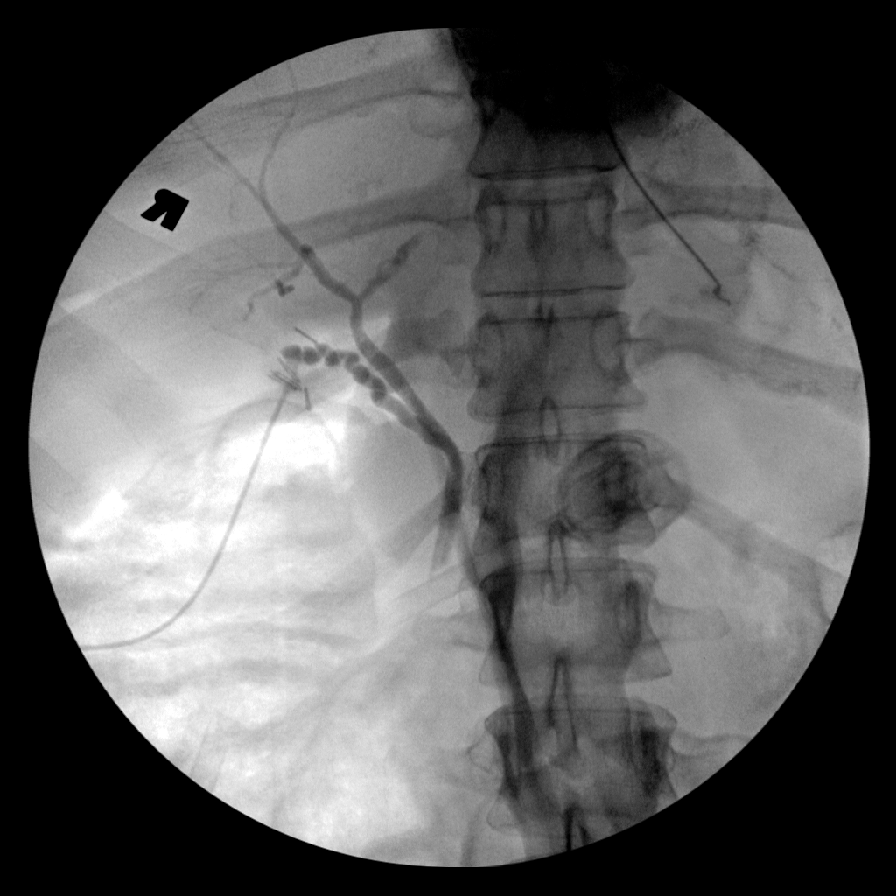
[im 1/3]
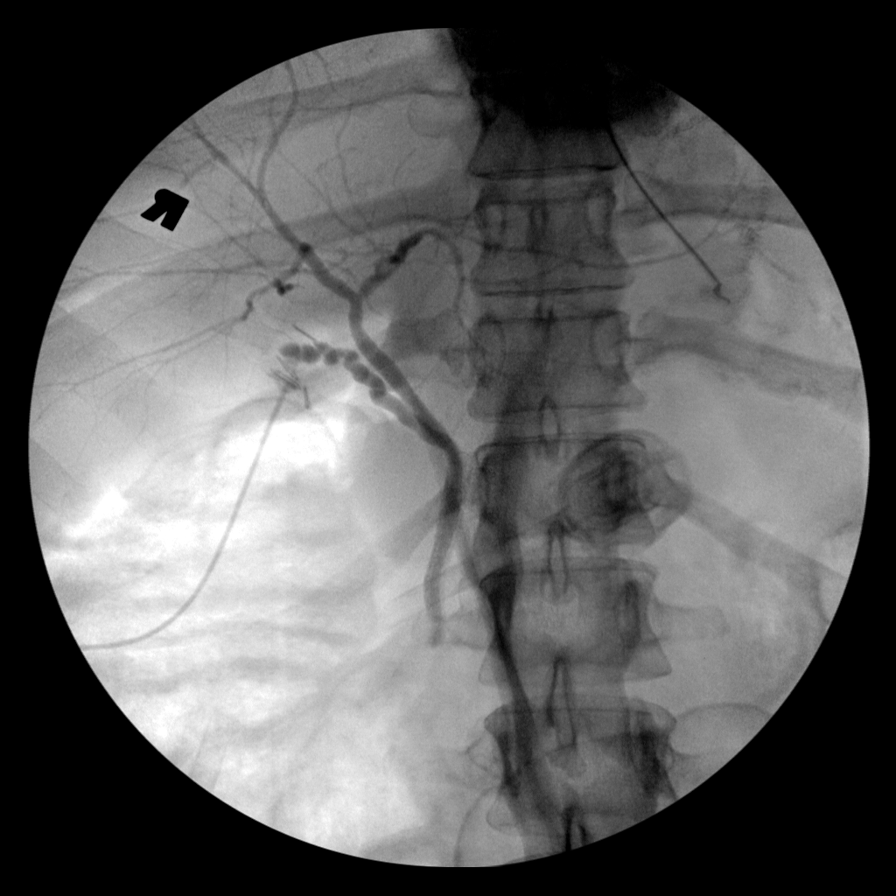
[im 1/3]
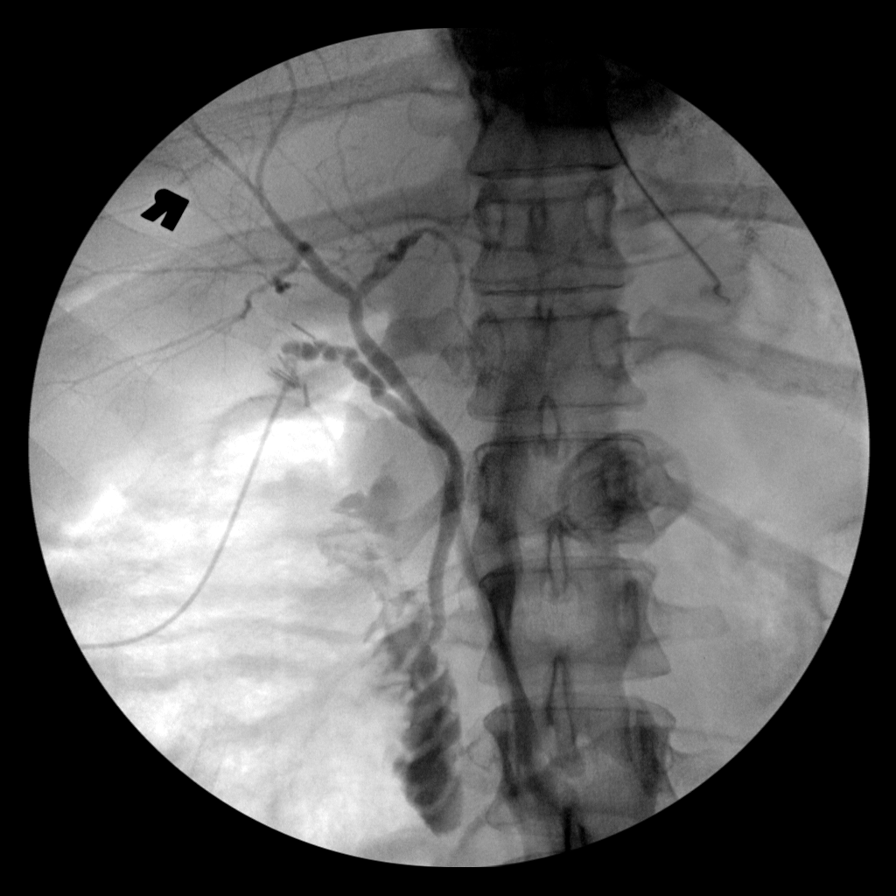
[im 1/3]
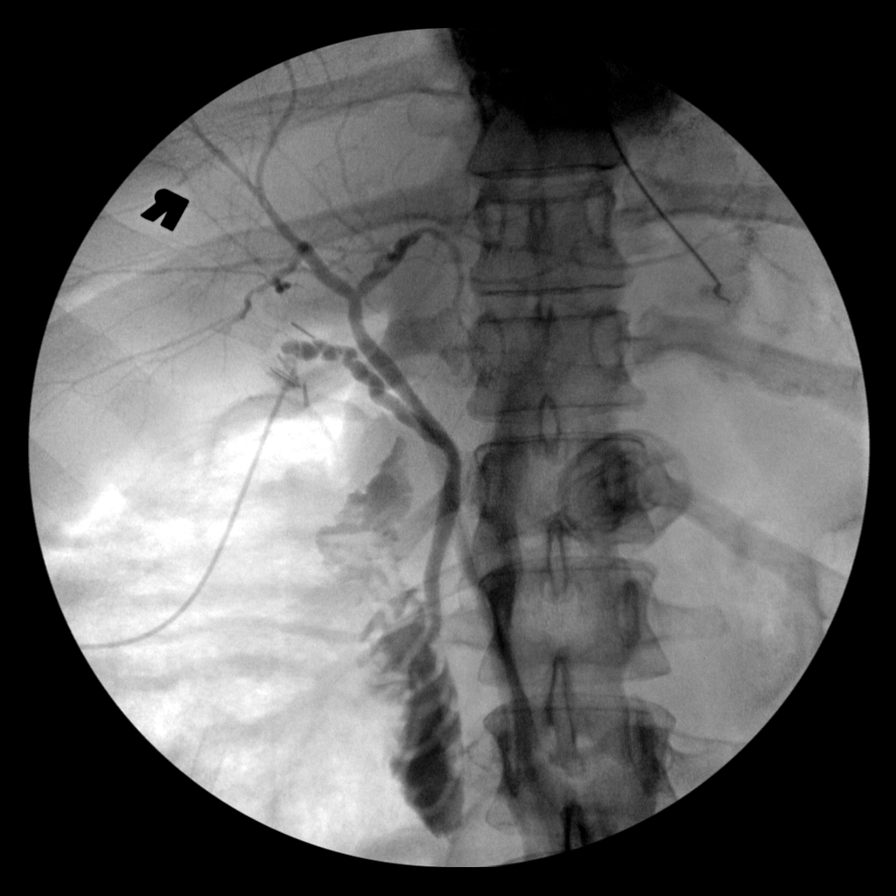
[im 2/3]
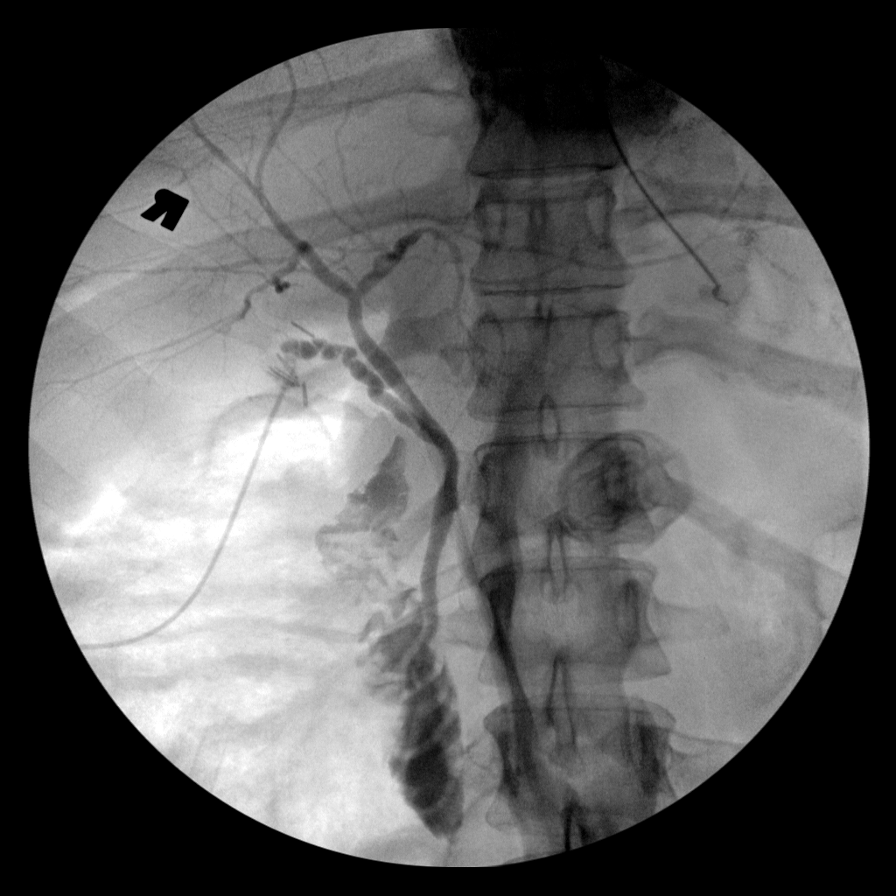
[im 3/3]
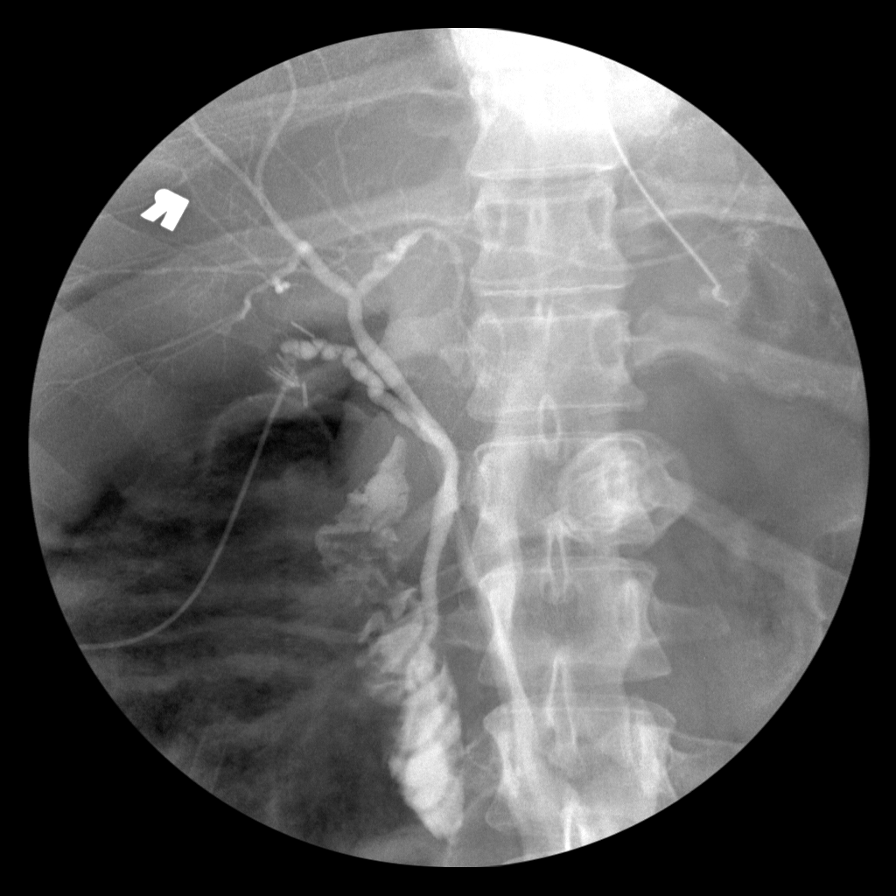

[6 of 6 positions shown; findings below may reference images not displayed]

FINDINGS: The biliary tree is normal with free flow of contrast
from the cystic duct into the biliary tree and into the duodenum.
IMPRESSION: Normal intraoperative cholangiogram.

## 2013-08-31 LAB — OB RESULTS CONSOLE HIV ANTIBODY (ROUTINE TESTING): HIV: NONREACTIVE

## 2013-08-31 LAB — OB RESULTS CONSOLE HEPATITIS B SURFACE ANTIGEN: Hepatitis B Surface Ag: NEGATIVE

## 2013-08-31 LAB — OB RESULTS CONSOLE ABO/RH: RH Type: POSITIVE

## 2013-08-31 LAB — OB RESULTS CONSOLE ANTIBODY SCREEN: Antibody Screen: NEGATIVE

## 2013-08-31 LAB — OB RESULTS CONSOLE RPR: RPR: NONREACTIVE

## 2013-08-31 LAB — OB RESULTS CONSOLE RUBELLA ANTIBODY, IGM: Rubella: IMMUNE

## 2013-09-11 LAB — OB RESULTS CONSOLE GC/CHLAMYDIA
Chlamydia: NEGATIVE
Gonorrhea: NEGATIVE

## 2013-11-06 ENCOUNTER — Encounter (INDEPENDENT_AMBULATORY_CARE_PROVIDER_SITE_OTHER): Payer: Self-pay | Admitting: General Surgery

## 2014-01-04 ENCOUNTER — Other Ambulatory Visit: Payer: Self-pay | Admitting: Obstetrics

## 2014-01-05 ENCOUNTER — Inpatient Hospital Stay (HOSPITAL_COMMUNITY)
Admission: AD | Admit: 2014-01-05 | Discharge: 2014-01-05 | Disposition: A | Discharge status: Home / Self Care | Payer: No Typology Code available for payment source | Source: Ambulatory Visit | Attending: Obstetrics and Gynecology | Admitting: Obstetrics and Gynecology

## 2014-01-05 ENCOUNTER — Encounter (HOSPITAL_COMMUNITY): Payer: Self-pay | Admitting: *Deleted

## 2014-01-05 DIAGNOSIS — D509 Iron deficiency anemia, unspecified: Secondary | ICD-10-CM | POA: Diagnosis present

## 2014-01-05 MED ORDER — SODIUM CHLORIDE 0.9 % IV SOLN
510.0000 mg | Freq: Once | INTRAVENOUS | Status: AC
  Administered 2014-01-05: 510 mg via INTRAVENOUS
  Filled 2014-01-05: qty 17

## 2014-01-05 MED ORDER — SODIUM CHLORIDE 0.9 % IV SOLN
INTRAVENOUS | Status: DC
Start: 2014-01-05 — End: 2014-01-05
  Administered 2014-01-05: 08:00:00 via INTRAVENOUS

## 2014-01-05 NOTE — Discharge Instructions (Signed)
Pregnancy and Anemia Anemia is a condition in which the concentration of red blood cells or hemoglobin in the blood is below normal. Hemoglobin is a substance in red blood cells that carries oxygen to the tissues of the body. Anemia results in not enough oxygen reaching these tissues.  Anemia during pregnancy is common because the fetus uses more iron and folic acid as it is developing. Your body may not produce enough red blood cells because of this. Also, during pregnancy, the liquid part of the blood (plasma) increases by about 50%, and the red blood cells increase by only 25%. This lowers the concentration of the red blood cells and creates a natural anemia-like situation.  CAUSES  The most common cause of anemia during pregnancy is not having enough iron in the body to make red blood cells (iron deficiency anemia). Other causes may include:  Folic acid deficiency.  Vitamin B12 deficiency.  Certain prescription or over-the-counter medicines.  Certain medical conditions or infections that destroy red blood cells.  A low platelet count and bleeding caused by antibodies that go through the placenta to the fetus from the mother's blood. SIGNS AND SYMPTOMS  Mild anemia may not be noticeable. If it becomes severe, symptoms may include:  Tiredness.  Shortness of breath, especially with exercise.  Weakness.  Fainting.  Pale looking skin.  Headaches.  Feeling a fast or irregular heartbeat (palpitations). DIAGNOSIS  The type of anemia is usually diagnosed from your family and medical history and blood tests. TREATMENT  Treatment of anemia during pregnancy depends on the cause of the anemia. Treatment can include:  Supplements of iron, vitamin B12, or folic acid.  A blood transfusion. This may be needed if blood loss is severe.  Hospitalization. This may be needed if there is significant continual blood loss.  Dietary changes. HOME CARE INSTRUCTIONS   Follow your dietitian's or  health care provider's dietary recommendations.  Increase your vitamin C intake. This will help the stomach absorb more iron.  Eat a diet rich in iron. This would include foods such as:  Liver.  Beef.  Whole grain bread.  Eggs.  Dried fruit.  Take iron and vitamins as directed by your health care provider.  Eat green leafy vegetables. These are a good source of folic acid. SEEK MEDICAL CARE IF:   You have frequent or lasting headaches.  You are looking pale.  You are bruising easily. SEEK IMMEDIATE MEDICAL CARE IF:   You have extreme weakness, shortness of breath, or chest pain.  You become dizzy or have trouble concentrating.  You have heavy vaginal bleeding.  You develop a rash.  You have bloody or black, tarry stools.  You faint.  You vomit up blood.  You vomit repeatedly.  You have abdominal pain.  You have a fever or persistent symptoms for more than 2-3 days.  You have a fever and your symptoms suddenly get worse.  You are dehydrated. MAKE SURE YOU:   Understand these instructions.  Will watch your condition.  Will get help right away if you are not doing well or get worse. Document Released: 04/02/2000 Document Revised: 01/24/2013 Document Reviewed: 11/15/2012 [facility] Patient Information 2015 [place], [place]. This information is not intended to replace advice given to you by your health care provider. Make sure you discuss any questions you have with your health care provider. Dieta rica en hierro (Iron-Rich Diet) Una dieta rica en hierro est compuesta por alimentos que tienen buena cantidad del mismo. El hierro es  un mineral importante que se utiliza para formar hemoglobina. La hemoglobina es una protena necesaria para que los glbulos rojos puedan transportar el oxgeno por todo el organismo. El nivel de hierro en sangre puede disminuir por no consumir:  El hierro suficiente en la dieta, por prdidas de [place].  Prdidas de  sangre.  Momentos que implican desarrollo, como durante el embarazo o durante el crecimiento y desarrollo de un nio. Niveles bajos de hierro pueden causar una disminucin del nmero de glbulos rojos. El resultado puede ser una anemia por dficit de hierro. Los sntomas de la anemia por dficit de hierro son:   Harrel Lemon de Engineer, drilling.  Debilidad.  Irritabilidad.  Aumento de la probabilidad de infecciones adems. Estas son algunas recomendaciones para la ingesta diaria de hierro.   Los varones de ms de 19 aos necesitan 8 mg de hierro Google.  Las [employer] 19 y los 50 aos necesitan 18 mg de hierro por Futures trader.  Las mujeres embarazadas necesitan 27 mg de hierro Google, y [employer] de 19 aos que estn amamantando necesitan 9 mg de hierro por [employer].  Las [employer] de 50 aos necesitan 8 mg de hierro Google. FUENTES DE HIERRO Hay dos tipos de hierro presentes en los alimentos: hierro hem y no hem. El hierro hem es mejor absorbido en el organismo que el no hem. El hierro hem se encuentra en la carne, el pollo y el pescado. El hierro no heme se [employer] granos, los porotos y los vegetales. Fuentes de hierro hem Alimento / Hierro (mg)  3 oz (85 gr.) de hgado de pollo / 10 mg  3 oz (85 gr.) de hgado de vaca / 5.5 mg  3 oz (85 gr.) de ostras / 8 mg  3 oz (85 gr.) de carne / 2-3 mg  3 oz (85 gr.) de langostinos / 2.8 mg  3 oz (85 gr.) de pavo / 2 mg  3 oz (85 gr.) de pollo / 1 mg  3 oz (85 gr.) de pescado (atn, halibut) / 1 mg  3 oz (85 gr.) de cerdo / 0.9 mg Fuentes de hierro no heme Alimento / Hierro (mg)  Cereal listo para consumir, fortificado con hierro / 3.9-7 mg   taza de tofu / 3.4 mg   taza de frijoles / 2.6 mg  Patatas al horno con piel / 2.7 mg   taza de esprragos / 2.2 mg  [place] / 2 mg   taza de duraznos disecados / 1.6 mg   taza de pasas de uva / 1.5 mg  1 taza de leche de soja / 1.5 mg  1 rebanada de pan integral / 1.2  mg  1 taza de espinacas / 0.8 mg   taza de brcoli / 0.6 mg LA ABSORCIN DEL HIERRO Ciertos alimentos disminuyen la absorcin del hierro en el organismo. Trate de evitar estos alimentos y bebidas cuando consuma una dieta rica en hierro:  Caf.  TForrestine Him.  Soja. Los alimentos que contienen vitamina C ayudan a aumentar la cantidad de hierro que el organismo absorbe, en especial la de las fuentes de hierro no heme. Consuma alimentos ricos en vitamina C junto con alimentos que contengan hierro para aumentar su absorcin. Los alimentos con alto contenido de vitamina C incluyen una variedad de frutas y Sports administrator. Buenas fuentes de vitamina C son:  Marcell Anger de Probation officer.  [place].  Jinny Sanders.  Mangos.  Toronjas.  Pimientos rojos.  Pimientos verdes.  Brcoli.  Patatas con piel.  Jugo de tomates. Document Released: 01/20/2006 Document Revised: 06/28/2011 [facility] Patient Information 2015 [place], [place]. This information is not intended to replace advice given to you by your health care provider. Make sure you discuss any questions you have with your health care provider.

## 2014-01-05 NOTE — MAU Note (Signed)
Pt presents to MAU for iron infusion. Denies any vaginal bleeding or LOF. States baby is active

## 2014-01-05 NOTE — Progress Notes (Signed)
TC - unable to locate order for FeraHeme this am Per PNR - 510 IV dose in MAU 9/19 - order entered in EPIC and Lupita Leash notified in MAU  [facility] Ernest[facility] Penna  Marlinda Mike CNM [facility]

## 2014-01-10 ENCOUNTER — Other Ambulatory Visit: Payer: Self-pay | Admitting: Obstetrics

## 2014-01-12 ENCOUNTER — Encounter (HOSPITAL_COMMUNITY): Payer: Self-pay | Admitting: *Deleted

## 2014-01-12 ENCOUNTER — Inpatient Hospital Stay (HOSPITAL_COMMUNITY)
Admission: AD | Admit: 2014-01-12 | Discharge: 2014-01-12 | Disposition: A | Discharge status: Home / Self Care | Payer: No Typology Code available for payment source | Source: Ambulatory Visit | Attending: Obstetrics & Gynecology | Admitting: Obstetrics & Gynecology

## 2014-01-12 DIAGNOSIS — D509 Iron deficiency anemia, unspecified: Secondary | ICD-10-CM | POA: Insufficient documentation

## 2014-01-12 MED ORDER — SODIUM CHLORIDE 0.9 % IV SOLN
Freq: Once | INTRAVENOUS | Status: AC
  Administered 2014-01-12: 07:00:00 via INTRAVENOUS

## 2014-01-12 MED ORDER — FERUMOXYTOL INJECTION 510 MG/17 ML
510.0000 mg | INTRAVENOUS | Status: DC
  Administered 2014-01-12: 510 mg via INTRAVENOUS
  Filled 2014-01-12: qty 17

## 2014-01-12 NOTE — MAU Note (Signed)
Here for IV iron infusion. Denies any pain. Increase vaginal d/c last couple days.

## 2014-02-18 ENCOUNTER — Encounter (HOSPITAL_COMMUNITY): Payer: Self-pay | Admitting: *Deleted

## 2014-03-07 LAB — OB RESULTS CONSOLE GBS: GBS: POSITIVE

## 2014-04-02 ENCOUNTER — Telehealth (HOSPITAL_COMMUNITY): Payer: Self-pay | Admitting: *Deleted

## 2014-04-02 ENCOUNTER — Other Ambulatory Visit: Payer: Self-pay | Admitting: Obstetrics

## 2014-04-02 NOTE — Telephone Encounter (Signed)
Preadmission screen  

## 2014-04-03 ENCOUNTER — Inpatient Hospital Stay (HOSPITAL_COMMUNITY)
Admission: AD | Admit: 2014-04-03 | Discharge: 2014-04-04 | DRG: 775 | Disposition: A | Discharge status: Home / Self Care | Payer: No Typology Code available for payment source | Source: Ambulatory Visit | Attending: Obstetrics | Admitting: Obstetrics

## 2014-04-03 ENCOUNTER — Encounter (HOSPITAL_COMMUNITY): Payer: Self-pay | Admitting: *Deleted

## 2014-04-03 ENCOUNTER — Inpatient Hospital Stay (HOSPITAL_COMMUNITY): Payer: No Typology Code available for payment source | Admitting: Anesthesiology

## 2014-04-03 ENCOUNTER — Inpatient Hospital Stay (HOSPITAL_COMMUNITY): Admission: RE | Admit: 2014-04-03 | Payer: Commercial Managed Care - PPO | Source: Ambulatory Visit

## 2014-04-03 DIAGNOSIS — Z3A39 39 weeks gestation of pregnancy: Secondary | ICD-10-CM | POA: Diagnosis present

## 2014-04-03 DIAGNOSIS — IMO0001 Reserved for inherently not codable concepts without codable children: Secondary | ICD-10-CM

## 2014-04-03 DIAGNOSIS — O09523 Supervision of elderly multigravida, third trimester: Secondary | ICD-10-CM | POA: Diagnosis not present

## 2014-04-03 DIAGNOSIS — O99824 Streptococcus B carrier state complicating childbirth: Secondary | ICD-10-CM | POA: Diagnosis present

## 2014-04-03 DIAGNOSIS — Z3483 Encounter for supervision of other normal pregnancy, third trimester: Secondary | ICD-10-CM | POA: Diagnosis present

## 2014-04-03 HISTORY — DX: Reserved for inherently not codable concepts without codable children: IMO0001

## 2014-04-03 LAB — CBC
HCT: 37.6 % (ref 36.0–46.0)
Hemoglobin: 12.7 g/dL (ref 12.0–15.0)
MCH: 27.9 pg (ref 26.0–34.0)
MCHC: 33.8 g/dL (ref 30.0–36.0)
MCV: 82.6 fL (ref 78.0–100.0)
Platelets: 266 10*3/uL (ref 150–400)
RBC: 4.55 MIL/uL (ref 3.87–5.11)
RDW: 19 % — ABNORMAL HIGH (ref 11.5–15.5)
WBC: 7.3 10*3/uL (ref 4.0–10.5)

## 2014-04-03 LAB — RPR

## 2014-04-03 MED ORDER — LACTATED RINGERS IV SOLN: 500.0000 mL | INTRAVENOUS | Status: DC | PRN

## 2014-04-03 MED ORDER — DIPHENHYDRAMINE HCL 50 MG/ML IJ SOLN: 12.5000 mg | INTRAMUSCULAR | Status: DC | PRN

## 2014-04-03 MED ORDER — SODIUM CHLORIDE 0.9 % IV SOLN
2.0000 g | Freq: Once | INTRAVENOUS | Status: AC
  Administered 2014-04-03: 2 g via INTRAVENOUS
  Filled 2014-04-03: qty 2000

## 2014-04-03 MED ORDER — FLEET ENEMA 7-19 GM/118ML RE ENEM: 1.0000 | ENEMA | RECTAL | Status: DC | PRN

## 2014-04-03 MED ORDER — OXYTOCIN BOLUS FROM INFUSION: 500.0000 mL | INTRAVENOUS | Status: DC

## 2014-04-03 MED ORDER — EPHEDRINE 5 MG/ML INJ
10.0000 mg | INTRAVENOUS | Status: DC | PRN
  Filled 2014-04-03: qty 2

## 2014-04-03 MED ORDER — IBUPROFEN 600 MG PO TABS
600.0000 mg | ORAL_TABLET | Freq: Four times a day (QID) | ORAL | Status: DC
  Administered 2014-04-03 – 2014-04-04 (×3): 600 mg via ORAL
  Filled 2014-04-03 (×3): qty 1

## 2014-04-03 MED ORDER — FENTANYL 2.5 MCG/ML BUPIVACAINE 1/10 % EPIDURAL INFUSION (WH - ANES)
14.0000 mL/h | INTRAMUSCULAR | Status: DC | PRN
  Administered 2014-04-03: 14 mL/h via EPIDURAL
  Filled 2014-04-03: qty 125

## 2014-04-03 MED ORDER — LIDOCAINE HCL (PF) 1 % IJ SOLN
INTRAMUSCULAR | Status: DC | PRN
  Administered 2014-04-03 (×4): 4 mL

## 2014-04-03 MED ORDER — PHENYLEPHRINE 40 MCG/ML (10ML) SYRINGE FOR IV PUSH (FOR BLOOD PRESSURE SUPPORT)
80.0000 ug | PREFILLED_SYRINGE | INTRAVENOUS | Status: DC | PRN
  Filled 2014-04-03: qty 10
  Filled 2014-04-03: qty 2

## 2014-04-03 MED ORDER — LIDOCAINE HCL (PF) 1 % IJ SOLN
30.0000 mL | INTRAMUSCULAR | Status: DC | PRN
  Filled 2014-04-03: qty 30

## 2014-04-03 MED ORDER — LACTATED RINGERS IV SOLN
INTRAVENOUS | Status: DC
  Administered 2014-04-03 (×3): 125 mL/h via INTRAVENOUS

## 2014-04-03 MED ORDER — LACTATED RINGERS IV SOLN
500.0000 mL | Freq: Once | INTRAVENOUS | Status: AC
  Administered 2014-04-03: 500 mL via INTRAVENOUS

## 2014-04-03 MED ORDER — OXYCODONE-ACETAMINOPHEN 5-325 MG PO TABS: 2.0000 | ORAL_TABLET | ORAL | Status: DC | PRN

## 2014-04-03 MED ORDER — OXYTOCIN 40 UNITS IN LACTATED RINGERS INFUSION - SIMPLE MED
1.0000 m[IU]/min | INTRAVENOUS | Status: DC
  Administered 2014-04-03: 2 m[IU]/min via INTRAVENOUS
  Filled 2014-04-03: qty 1000

## 2014-04-03 MED ORDER — ACETAMINOPHEN 325 MG PO TABS: 650.0000 mg | ORAL_TABLET | ORAL | Status: DC | PRN

## 2014-04-03 MED ORDER — TERBUTALINE SULFATE 1 MG/ML IJ SOLN: 0.2500 mg | Freq: Once | INTRAMUSCULAR | Status: AC | PRN

## 2014-04-03 MED ORDER — OXYCODONE-ACETAMINOPHEN 5-325 MG PO TABS: 1.0000 | ORAL_TABLET | ORAL | Status: DC | PRN

## 2014-04-03 MED ORDER — OXYTOCIN 40 UNITS IN LACTATED RINGERS INFUSION - SIMPLE MED
62.5000 mL/h | INTRAVENOUS | Status: DC
  Administered 2014-04-03 (×2): 62.5 mL/h via INTRAVENOUS

## 2014-04-03 MED ORDER — PHENYLEPHRINE 40 MCG/ML (10ML) SYRINGE FOR IV PUSH (FOR BLOOD PRESSURE SUPPORT)
80.0000 ug | PREFILLED_SYRINGE | INTRAVENOUS | Status: DC | PRN
  Filled 2014-04-03: qty 2

## 2014-04-03 MED ORDER — ONDANSETRON HCL 4 MG/2ML IJ SOLN: 4.0000 mg | Freq: Four times a day (QID) | INTRAMUSCULAR | Status: DC | PRN

## 2014-04-03 MED ORDER — CITRIC ACID-SODIUM CITRATE 334-500 MG/5ML PO SOLN: 30.0000 mL | ORAL | Status: DC | PRN

## 2014-04-03 NOTE — H&P (Signed)
Leslie Bray is a 38 y.o. G2P1001 at 7187w5d presenting for active labor. Pt notes onset contractions at 2 am . Good fetal movement, No vaginal bleeding, not leaking fluid.  [facility] since 1st trimester - JEHOVAH WITNESS- no blood products - h/o anemia, Hgb up to 12 w/ IV and po iron - h/o gastric bypass, vitamin supplementation. Unable to do DS, nl home BS qid x 1wk - GBS pos. - pyelo in last preg, no abx prophylaxis this preg but routine UCx neg - fetal growth- 11/19, 36 wks, 7'0, 80%, ant placenta   Prenatal Transfer Tool  Maternal Diabetes: No Genetic Screening: Normal Maternal Ultrasounds/Referrals: Normal Fetal Ultrasounds or other Referrals:  None Maternal Substance Abuse:  No Significant Maternal Medications:  None Significant Maternal Lab Results: None     OB History    Gravida Para Term Preterm AB TAB SAB Ectopic Multiple Living   2 1 1       1      Past Medical History  Diagnosis Date  . Vitamin D deficiency   . Allergy     seasonal  . Low blood sugar   . UTI (urinary tract infection)   . Fibromyalgia   . Refusal of blood transfusions as patient is Jehovah's Witness 09/10/11  . Low blood sugar   . Gastric bypass status for obesity 04/2010  . Gallbladder and bile duct calculus with cholecystitis with obstruction 08/2011  . Hx of LASIK 04/2002  . Hx of LASIK 10/2008  . Anemia   . Active labor at term 04/03/2014   Past Surgical History  Procedure Laterality Date  . Lasik  2004, 2010    bilateral  . [place]09/[place]  . Cholecystectomy  09/10/11  . Cholecystectomy  09/10/2011    Procedure: LAPAROSCOPIC CHOLECYSTECTOMY WITH INTRAOPERATIVE CHOLANGIOGRAM;  Surgeon: Atilano InaEric M Wilson, MD,FACS;  Location: [facility] OR;  Service: General;  Laterality: N/A;  . Esophagogastroduodenoscopy  11/09/2011    Procedure: ESOPHAGOGASTRODUODENOSCOPY (EGD);  Surgeon: Atilano InaEric M Wilson, MD,FACS;  [facility];  Service: General;   Laterality: N/A;   Family History: family history includes Hyperlipidemia in her mother. Social History:  reports that she has never smoked. She has never used smokeless tobacco. She reports that she does not drink alcohol or use illicit drugs.  Review of Systems - Negative except painful contractions   Dilation: 6 Effacement (%): 90 Station: -2 Exam by:: Building control surveyorDenise Collison RN Blood pressure 101/52, pulse 59, temperature 97.9 F (36.6 C), temperature source Oral, resp. rate 20, height 5\' 7"  (1.702 m), weight 86.637 kg (191 lb), last menstrual period 06/29/2013, SpO2 100 %.  Physical Exam:  Gen: well appearing, no distress- now comfortable, s/p epidural  Abd: gravid, NT, no RUQ pain LE: no edema, equal bilaterally, non-tender Toco: not tracing well FH: baseline 130s, accelerations present, no deceleratons, 10 beat variability  Prenatal labs: ABO, Rh: A/Positive/-- (05/15 0000) Antibody: Negative (05/15 0000) Rubella:  immune RPR: Nonreactive (05/15 0000)  HBsAg: Negative (05/15 0000)  HIV: Non-reactive (05/15 0000)  GBS: Positive (11/19 0000)  1 hr Glucola not done due to gastric bypass, nl home BS monitoring  Genetic screening nl Informaseq- XX, nl AFP Anatomy US normal   Assessment/Plan: 38 y.o. G2P1001 at 3387w5d Active labor. Plan expectant management - GBS pos. Ampicillin given - Reactive fetal testing - Jehovah witness, no blood products   FOGLEMAN,KELLY A. 04/03/2014, 8:26 AM

## 2014-04-03 NOTE — MAU Note (Signed)
Pt was a scheduled induction for 0700 but began contracting at 0230 and came in to MAU. Pt denies leaking of fluid and bleeding and states baby is active.

## 2014-04-03 NOTE — Anesthesia Preprocedure Evaluation (Signed)
Anesthesia Evaluation  Patient identified by MRN, date of birth, ID band Patient awake    Reviewed: Allergy & Precautions, H&P , NPO status , Patient's Chart, lab work & pertinent test results, reviewed documented beta blocker date and time   History of Anesthesia Complications Negative for: history of anesthetic complications  Airway Mallampati: I  TM Distance: >3 FB Neck ROM: full    Dental  (+) Teeth Intact   Pulmonary neg pulmonary ROS,  breath sounds clear to auscultation        Cardiovascular negative cardio ROS  Rhythm:regular Rate:Normal     Neuro/Psych negative neurological ROS  negative psych ROS   GI/Hepatic Neg liver ROS, GERD-  Medicated,S/p gastric bypass   Endo/Other  negative endocrine ROS  Renal/GU negative Renal ROS     Musculoskeletal  (+) Fibromyalgia -  Abdominal   Peds  Hematology  (+) anemia , REFUSES BLOOD PRODUCTS, JEHOVAH'S WITNESS  Anesthesia Other Findings   Reproductive/Obstetrics (+) Pregnancy                             Anesthesia Physical Anesthesia Plan  ASA: II  Anesthesia Plan: Epidural   Post-op Pain Management:    Induction:   Airway Management Planned:   Additional Equipment:   Intra-op Plan:   Post-operative Plan:   Informed Consent: I have reviewed the patients History and Physical, chart, labs and discussed the procedure including the risks, benefits and alternatives for the proposed anesthesia with the patient or authorized representative who has indicated his/her understanding and acceptance.     Plan Discussed with:   Anesthesia Plan Comments:         Anesthesia Quick Evaluation

## 2014-04-03 NOTE — Anesthesia Procedure Notes (Signed)
Epidural Patient location during procedure: OB Start time: 04/03/2014 7:40 AM  Staffing Anesthesiologist: CASSIDY, AMY Performed by: anesthesiologist   Preanesthetic Checklist Completed: patient identified, site marked, surgical consent, pre-op evaluation, timeout performed, IV checked, risks and benefits discussed and monitors and equipment checked  Epidural Patient position: sitting Prep: site prepped and draped and DuraPrep Patient monitoring: continuous pulse ox and blood pressure Approach: midline Location: L3-L4 Injection technique: LOR air  Needle:  Needle type: Tuohy  Needle gauge: 17 G Needle length: 9 cm and 9 Needle insertion depth: 6 cm Catheter type: closed end flexible Catheter size: 19 Gauge Catheter at skin depth: 11 cm Test dose: negative  Assessment Events: blood not aspirated, injection not painful, no injection resistance, negative IV test and no paresthesia  Additional Notes Discussed risk of headache, infection, bleeding, nerve injury and failed or incomplete block.  Patient voices understanding and wishes to proceed.  Epidural placed easily on first attempt.  Transient left paresthesia.  Patient tolerated procedure well with no apparent complications.  Jasmine DecemberA. Cassidy, MDReason for block:procedure for pain

## 2014-04-04 LAB — CBC
HCT: 32.5 % — ABNORMAL LOW (ref 36.0–46.0)
Hemoglobin: 10.8 g/dL — ABNORMAL LOW (ref 12.0–15.0)
MCH: 27.7 pg (ref 26.0–34.0)
MCHC: 33.2 g/dL (ref 30.0–36.0)
MCV: 83.3 fL (ref 78.0–100.0)
Platelets: 227 10*3/uL (ref 150–400)
RBC: 3.9 MIL/uL (ref 3.87–5.11)
RDW: 19.2 % — ABNORMAL HIGH (ref 11.5–15.5)
WBC: 10.5 10*3/uL (ref 4.0–10.5)

## 2014-04-04 MED ORDER — TETANUS-DIPHTH-ACELL PERTUSSIS 5-2.5-18.5 LF-MCG/0.5 IM SUSP: 0.5000 mL | Freq: Once | INTRAMUSCULAR | Status: DC

## 2014-04-04 MED ORDER — ONDANSETRON HCL 4 MG/2ML IJ SOLN: 4.0000 mg | INTRAMUSCULAR | Status: DC | PRN

## 2014-04-04 MED ORDER — OXYCODONE-ACETAMINOPHEN 5-325 MG PO TABS: 2.0000 | ORAL_TABLET | ORAL | Status: DC | PRN

## 2014-04-04 MED ORDER — FLEET ENEMA 7-19 GM/118ML RE ENEM: 1.0000 | ENEMA | Freq: Every day | RECTAL | Status: DC | PRN

## 2014-04-04 MED ORDER — BISACODYL 10 MG RE SUPP: 10.0000 mg | Freq: Every day | RECTAL | Status: DC | PRN

## 2014-04-04 MED ORDER — ZOLPIDEM TARTRATE 5 MG PO TABS: 5.0000 mg | ORAL_TABLET | Freq: Every evening | ORAL | Status: DC | PRN

## 2014-04-04 MED ORDER — OXYCODONE-ACETAMINOPHEN 5-325 MG PO TABS: 1.0000 | ORAL_TABLET | ORAL | Status: DC | PRN

## 2014-04-04 MED ORDER — DIBUCAINE 1 % RE OINT: 1.0000 "application " | TOPICAL_OINTMENT | RECTAL | Status: DC | PRN

## 2014-04-04 MED ORDER — BENZOCAINE-MENTHOL 20-0.5 % EX AERO
1.0000 "application " | INHALATION_SPRAY | CUTANEOUS | Status: DC | PRN
  Administered 2014-04-04: 1 via TOPICAL
  Filled 2014-04-04: qty 56

## 2014-04-04 MED ORDER — PRENATAL MULTIVITAMIN CH
1.0000 | ORAL_TABLET | Freq: Every day | ORAL | Status: DC
Start: 2014-04-04 — End: 2014-04-04
  Administered 2014-04-04: 1 via ORAL
  Filled 2014-04-04: qty 1

## 2014-04-04 MED ORDER — SENNOSIDES-DOCUSATE SODIUM 8.6-50 MG PO TABS
2.0000 | ORAL_TABLET | ORAL | Status: DC
  Administered 2014-04-04: 2 via ORAL
  Filled 2014-04-04: qty 2

## 2014-04-04 MED ORDER — DOCUSATE SODIUM 100 MG PO CAPS
100.0000 mg | ORAL_CAPSULE | Freq: Every day | ORAL | Status: DC
  Administered 2014-04-04: 100 mg via ORAL
  Filled 2014-04-04: qty 1

## 2014-04-04 MED ORDER — ONDANSETRON HCL 4 MG PO TABS: 4.0000 mg | ORAL_TABLET | ORAL | Status: DC | PRN

## 2014-04-04 MED ORDER — LANOLIN HYDROUS EX OINT: TOPICAL_OINTMENT | CUTANEOUS | Status: DC | PRN

## 2014-04-04 MED ORDER — IBUPROFEN 800 MG PO TABS
800.0000 mg | ORAL_TABLET | Freq: Three times a day (TID) | ORAL | Status: DC
  Administered 2014-04-04: 800 mg via ORAL
  Filled 2014-04-04: qty 1

## 2014-04-04 MED ORDER — FAMOTIDINE 20 MG PO TABS: 20.0000 mg | ORAL_TABLET | Freq: Two times a day (BID) | ORAL | Status: DC

## 2014-04-04 MED ORDER — SIMETHICONE 80 MG PO CHEW: 80.0000 mg | CHEWABLE_TABLET | ORAL | Status: DC | PRN

## 2014-04-04 MED ORDER — WITCH HAZEL-GLYCERIN EX PADS: 1.0000 "application " | MEDICATED_PAD | CUTANEOUS | Status: DC | PRN

## 2014-04-04 MED ORDER — DIPHENHYDRAMINE HCL 25 MG PO CAPS: 25.0000 mg | ORAL_CAPSULE | Freq: Four times a day (QID) | ORAL | Status: DC | PRN

## 2014-04-04 MED ORDER — IBUPROFEN 800 MG PO TABS: 800.0000 mg | ORAL_TABLET | Freq: Three times a day (TID) | ORAL | Status: DC

## 2014-04-04 NOTE — Lactation Note (Signed)
This note was copied from the chart of Leslie Bray. Lactation Consultation Note  Patient Name: Leslie Mallie SnooksMaritza De Bray ZOXWR'UToday's Date: 04/04/2014 Reason for consult: Follow-up assessment  Mom called LC back to assess latch and assisted mom with diaper change ( large wet, and large stool ). Baby awake and hungry. LC assisted mom with positioning in football position and depth at the breast.  Initially mom felt some discomfort , improved with breast compressions. Multiply swallows noted, increased with let -down. Baby fed 29 mins on the 1st breast ( right ) , and was still feeding on the 2nd breast with multiply swallows. LC instructed mom on the use shells and hand pump for home. LC showed dad how he could assist mom with depth at the breast.    Maternal Data Has patient been taught Hand Expression?: Yes Does the patient have breastfeeding experience prior to this delivery?: Yes  Feeding Feeding Type: Breast Fed Length of feed:  (still feeding at 14 mins )  LATCH Score/Interventions Latch: Grasps breast easily, tongue down, lips flanged, rhythmical sucking.  Audible Swallowing: Spontaneous and intermittent  Type of Nipple: Everted at rest and after stimulation  Comfort (Breast/Nipple): Filling, red/small blisters or bruises, mild/mod discomfort  Problem noted: Mild/Moderate discomfort  Hold (Positioning): Assistance needed to correctly position infant at breast and maintain latch. Intervention(s): Breastfeeding basics reviewed;Support Pillows;Position options;Skin to skin  LATCH Score: 8  Lactation Tools Discussed/Used Tools: Pump Flange Size: 27 (X 2 ) Breast pump type: Manual Pump Review: Milk Storage Initiated by:: Mai  Date initiated:: 04/04/14   Consult Status Consult Status: Complete Date: 04/04/14 Follow-up type: In-patient    Kathrin Greathouseorio, Margaret Ann 04/04/2014, 4:26 PM

## 2014-04-04 NOTE — Lactation Note (Signed)
This note was copied from the chart of Leslie Terrilee FilesMaritza De Bray. Lactation Consultation Note  Patient Name: Leslie Mallie SnooksMaritza De Bray [name]'s Date: 04/04/2014 Reason for consult: Initial assessment  Baby is 23 hours old and has been to the breast several times with average latch of 15 -20 mins. , also a few 45 mins. Mom has Hx of gastric bypass surgery in 2012 . Mom reports breast changes with 1st baby and milk coming in and engorgement issues for 1 week. Ended up breast feeding for 5 months had to supplement de to weight loss. This pregnancy has had breast changes in size and soreness. Per mom experiencing sore nipples at present , LC assessed with moms permission and noted both areolas to have edema ,but compressible .  LC reviewed steps for latching prior to latch - breast massage, hand express, pre-pump if needed ( when milk comes in ) , and reverse pressure. LC showed mom and , steady flow of colostrum noted from left breast. Mom already has comfort gels. Also per mom has a DEBP , Medela at home.  LC gave mom 2 - #27 Flanges for when her milk comes in due to areola edema. LC encouraged mom to call LC for feeding assessment next feeding.  Sore nipple and engorgement prevention and tx reviewed , referring to the Baby and me booklet. Mother informed of post-discharge support and given phone number to the lactation department, including services for phone call assistance; out-patient appointments; and breastfeeding support group. List of other breastfeeding resources in the community given in the handout. Encouraged mother to call for problems or concerns related to breastfeeding.   Maternal Data Has patient been taught Hand Expression?: Yes Does the patient have breastfeeding experience prior to this delivery?: Yes  Feeding Feeding Type: Breast Fed Length of feed: 20 min (per  mom )  LATCH Score/Interventions                Intervention(s): Breastfeeding basics reviewed      Lactation Tools Discussed/Used Tools: Flanges Flange Size: 27 (X 2 ) Pump Review: Milk Storage Initiated by:: Mai  Date initiated:: 04/04/14   Consult Status Date: 04/04/14 Follow-up type: In-patient    Kathrin Greathouseorio, Margaret Ann 04/04/2014, 2:34 PM

## 2014-04-04 NOTE — Progress Notes (Signed)
PPD 1 SVD  S:  Reports feeling well - tired / requests DC today if possible             Tolerating po/ No nausea or vomiting             Bleeding is moderate             Pain controlled with Motrin and Percocet - still having intense cramps             Up ad lib / ambulatory / voiding QS  Newborn breast feeding  / female  O:               VS: BP 105/52 mmHg  Pulse 67  Temp(Src) 98.7 F (37.1 C) (Oral)  Resp 16  Ht 5\' 7"  (1.702 m)  Wt 86.637 kg (191 lb)  BMI 29.91 kg/m2  SpO2 100%  LMP 06/29/2013  Breastfeeding? Unknown   LABS:              Recent Labs  04/03/14 0645 04/04/14 0555  WBC 7.3 10.5  HGB 12.7 10.8*  PLT 266 227               Blood type: A/Positive/-- (05/15 0000)  Rubella: Immune (05/15 0000)                   Flu and TdaP up to date              Physical Exam:             Alert and oriented X3  Abdomen: soft, non-tender, non-distended              Fundus: firm, non-tender, U-1  Perineum: small edema - ice pack in place  Lochia: moderate  Extremities: no edema, no calf pain or tenderness    A: PPD # 1   Doing well - stable status  P: Routine post partum orders  Increase Ibuprofen dose today             Stable for DC this PM if Peds DC newborn  Marlinda MikeBAILEY, TANYA CNM, MSN, [facility]ACNM 04/04/2014, 8:17 AM

## 2014-04-04 NOTE — Discharge Summary (Signed)
Obstetric Discharge Summary  Reason for Admission: onset of labor Prenatal Procedures: none Intrapartum Procedures: spontaneous vaginal delivery, GBS prophylaxis and epidural Postpartum Procedures: none Complications-Operative and Postpartum: 2nd degree perineal laceration HEMOGLOBIN  Date Value Ref Range Status  04/04/2014 10.8* 12.0 - 15.0 g/dL Final   HCT  Date Value Ref Range Status  04/04/2014 32.5* 36.0 - 46.0 % Final    Physical Exam:  General: alert, cooperative and no distress Lochia: appropriate Uterine Fundus: firm Incision: healing well DVT Evaluation: No evidence of DVT seen on physical exam.  Discharge Diagnoses: Term Pregnancy-delivered and IDA of pregnancy - delivered  Discharge Information: Date: 04/04/2014 Activity: pelvic rest Diet: routine Medications: PNV, Ibuprofen, Iron and Percocet Condition: stable Instructions: refer to practice specific booklet Discharge to: home Follow-up Information    [facility] Schedule an appointment as soon as possible for a visit in 6 weeks.   Specialty:  Obstetrics and Gynecology   Contact information:   [address] [place] [id number] [phone]       Newborn Data: Live born female  Birth Weight: 8 lb 5.5 oz (3785 g) APGAR: 7, 9  Home with mother.  Marlinda MikeBAILEY, TANYA 04/04/2014, 4:44 PM

## 2014-04-04 NOTE — Anesthesia Postprocedure Evaluation (Signed)
Anesthesia Post Note  Patient: Leslie CroftMaritza J De Bray  Procedure(s) Performed: * No procedures listed *  Anesthesia type: Epidural  Patient location: Mother/Baby  Post pain: Pain level controlled  Post assessment: Post-op Vital signs reviewed  Last Vitals:  Filed Vitals:   04/04/14 0606  BP: 105/52  Pulse: 67  Temp: 37.1 C  Resp: 16    Post vital signs: Reviewed  Level of consciousness:alert  Complications: No apparent anesthesia complications

## 2014-04-10 ENCOUNTER — Ambulatory Visit (HOSPITAL_COMMUNITY)
Admission: RE | Admit: 2014-04-10 | Discharge: 2014-04-10 | Disposition: A | Discharge status: Home / Self Care | Payer: No Typology Code available for payment source | Source: Ambulatory Visit | Attending: Obstetrics | Admitting: Obstetrics

## 2014-04-10 NOTE — Lactation Note (Signed)
Lactation Consult  Mother's reason for visit:  Low milk supply  Visit Type: Baby is 204 days old when the apt was made , mom is breast feeding and exp sore nipples , especially the right . Engorgement both breast , hx gastric by pass, and low milk supply with previous child .  Mom states the baby is cue feeding about every 2-3 hours and mom also is post pumping for additional relief of engorgement , as well using the cold between feedings , warmth and massage prior to feeding. , taking Motrin , denies mastitis at this time - confirmed appointment.  Appointment Notes:  See above note.  Consult:  Initial Lactation Consultant:  Leslie Bray, Leslie Bray  ________________________________________________________________________ Leslie FloresBaby's Name: Leslie RudeYarisleidy Bray Date of Birth: 04/03/2014 Pediatrician:[facility]  Gender: female Gestational Age: 4863w5d (At Birth) Birth Weight: 8 lb 5.5 oz (3785 g) Weight at Discharge: Weight: 8 lb 2.3 oz (3695 g)Date of Discharge: 04/04/2014 Filed Weights   04/03/14 1522 04/04/14 0029  Weight: 8 lb 5.5 oz (3785 g) 8 lb 2.3 oz (3695 g)   [facility]  Weight today:7-12.3 oz     ________________________________________________________________________  Mother's Name: Leslie CroftMaritza J De Bray Type of delivery:  Vaginal Delivery  Breastfeeding Experience: Per mom baby was latching well in the hospital and when milk came in and I had engorgement it has  been more of a struggle, she seems like she isn't getting enough so I started supplementing.  Maternal Medical Conditions:  Vitamin d deficiency , low blood sugar , gastric by pass 2012, anemia  Maternal Medications:  PNV, NEXUM , VItamin B12   ________________________________________________________________________  Breastfeeding History (Post Discharge)  Frequency of breastfeeding:  8-10 x's a day   Duration of feeding:  15 -30 mins   Pumping - per mom with a DEBP - getting 15 -30 ml total , 3 x's a day for 10 mins   Supplementing - per mom expressed  Milk or Enfamil ) 30 -45 ml after the baby has been to the breast   Infant Intake and Output Assessment  Voids: 6 n 24 hrs.  Color:  Clear yellow Stools:  1-3 in 24 hrs. or:  Yellow  ________________________________________________________________________  Maternal Breast Assessment  Breast:  Soft Nipple:  Erect Pain level:  0 Pain interventions:  Expressed breast milk  _______________________________________________________________________ Feeding Assessment/Evaluation  Initial feeding assessment:  Infant's oral assessment:  WNL  Positioning:  Football Right breast  LATCH documentation:  Latch:  2 = Grasps breast easily, tongue down, lips flanged, rhythmical sucking.  Audible swallowing:  1 = A few with stimulation  Type of nipple:  2 = Everted at rest and after stimulation  Comfort (Breast/Nipple):  2 = Soft / non-tender  Hold (Positioning):  1 = Assistance needed to correctly position infant at breast and maintain latch  LATCH score: 8   Attached assessment:  Shallow @ 1ST . LC assisted with depth   Lips flanged:  Yes.    Lips untucked:  Yes.    Suck assessment:  Nutritive and non - nutritive with latch , needed stimulation  Tools:  None with feeding  Instructed on use and cleaning of tool:  No.  Pre-feed weight:  3522 g , 7-12.3 oz  Post-feed weight:  3524 g , 7-12.3 oz  Amount transferred:  2 ml  Amount supplemented:  None   Additional Feeding Assessment -   Infant's  oral assessment:  WNL  Positioning:  Football Left breast  LATCH documentation:  Latch:  2 = Grasps breast easily, tongue down, lips flanged, rhythmical sucking.  Audible swallowing:  2 = Spontaneous and intermittent  Type of nipple:  2 = Everted at rest and after stimulation  Comfort (Breast/Nipple):  2 = Soft / non-tender  Hold  (Positioning):  1 = Assistance needed to correctly position infant at breast and maintain latch  LATCH score:  9   Attached assessment:  Deep  Lips flanged:  Yes.    Lips untucked:  Yes.    Suck assessment:  Nutritive and Nonnutritive  Tools:  None  Instructed on use and cleaning of tool:  No.  Pre-feed weight:  3524 g , 7-12.3 oz  Post-feed weight: 3530 g , 7-12.5 oz  Amount transferred:  6 ml  Amount supplemented:  None   Additional feeding : re-latched , added SNS ,  Pre- weight - 3530 g , 7-12.5 oz  Post - weight - 3566 g , 7-13.8 oz  Amount transferred = 6 ml  Amount supplemented with SNS = 30 ml    Total amount pumped post feed:  Did not post pump at consult   Total amount transferred:  14 ml Total supplement given:  30  Ml ( enfamil 20 cal from SNS at the Breast with latch )  Total amount for feeding = 44 ml    Lactation Plan of care : Praised mom for her efforts breast feeding                                           Feedings every 3 hours and with feeding cues, cluster feedings normal with growth spurts                                           Option #1 - feed the baby at the breast with SNS ( starting with 30 ml , and increase as needed ) next couple of days will probably be taking up to 45 ml                                           Option #2 - Give the baby an appetizer from a bottle ( due to him being use to it ) 10 ml , then feed at the breast , supplement after with bottle                                                                This option is if you aren't using the SNS                                           Extra pumping - with DEBP , both breast after 4-5 feedings a day for `10 -15 mins , save milk and  use it in an SNS or bottle

## 2015-02-21 ENCOUNTER — Encounter: Payer: Self-pay | Admitting: Family Medicine

## 2015-02-21 ENCOUNTER — Ambulatory Visit (INDEPENDENT_AMBULATORY_CARE_PROVIDER_SITE_OTHER): Payer: Medicaid Other | Admitting: Family Medicine

## 2015-02-21 ENCOUNTER — Encounter: Payer: Self-pay | Admitting: *Deleted

## 2015-02-21 VITALS — BP 110/63 | HR 61 | Temp 98.5°F | Wt 177.1 lb

## 2015-02-21 DIAGNOSIS — Z308 Encounter for other contraceptive management: Secondary | ICD-10-CM | POA: Diagnosis not present

## 2015-02-21 NOTE — Progress Notes (Signed)
   Subjective:    Patient ID: Leslie Bray, female    DOB: Mar 15, 1976, 39 y.o.   MRN: [id number]  HPI Patient seen for bilateral tubal ligation. Patient has tried other forms of oral contraception, but is looking for a permanent solution. She is currently on oral contraceptive pills.   Surgical history includes cholecystectomy and history of a Roux-en-Y gastric bypass, both which were done laparoscopically.   Review of Systems  Constitutional: Negative for fever, chills and fatigue.  Respiratory: Negative for shortness of breath and wheezing.   Cardiovascular: Negative for chest pain, palpitations and leg swelling.  Gastrointestinal: Negative for nausea, vomiting, abdominal pain and diarrhea.  All other systems reviewed and are negative.      Objective:   Physical Exam  Constitutional: She appears well-developed and well-nourished.  HENT:  Head: Normocephalic and atraumatic.  Right Ear: External ear normal.  Left Ear: External ear normal.  Cardiovascular: Normal rate, regular rhythm and normal heart sounds.  Exam reveals no gallop and no friction rub.   No murmur heard. Pulmonary/Chest: Effort normal. No respiratory distress. She has no wheezes. She has no rales. She exhibits no tenderness.  Abdominal: Soft. She exhibits no distension and no mass. There is no tenderness. There is no rebound and no guarding.  Skin: Skin is warm and dry.  Psychiatric: She has a normal mood and affect. Her behavior is normal. Thought content normal.      Assessment & Plan:  1. Encounter for other contraceptive management Discussed laparoscopic BTL, as well as IUD, Depo-Provera, nexplanon use.. 30 day paper signed. Patient will talk procedure over with husband and call back to schedule within the next couple of weeks.  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 I explained that blood loss usually minimal during this type of surgery.

## 2015-07-03 DIAGNOSIS — E559 Vitamin D deficiency, unspecified: Secondary | ICD-10-CM | POA: Insufficient documentation

## 2015-07-03 DIAGNOSIS — E538 Deficiency of other specified B group vitamins: Secondary | ICD-10-CM | POA: Insufficient documentation

## 2015-07-03 DIAGNOSIS — D509 Iron deficiency anemia, unspecified: Secondary | ICD-10-CM | POA: Insufficient documentation

## 2015-07-09 DIAGNOSIS — Z9884 Bariatric surgery status: Secondary | ICD-10-CM | POA: Insufficient documentation

## 2015-10-28 DIAGNOSIS — Z9889 Other specified postprocedural states: Secondary | ICD-10-CM | POA: Insufficient documentation

## 2016-01-12 DIAGNOSIS — Z9089 Acquired absence of other organs: Secondary | ICD-10-CM | POA: Insufficient documentation

## 2016-06-21 DIAGNOSIS — IMO0001 Reserved for inherently not codable concepts without codable children: Secondary | ICD-10-CM | POA: Insufficient documentation

## 2016-06-21 DIAGNOSIS — Z789 Other specified health status: Secondary | ICD-10-CM | POA: Insufficient documentation

## 2017-03-18 DIAGNOSIS — E162 Hypoglycemia, unspecified: Secondary | ICD-10-CM | POA: Insufficient documentation

## 2017-03-18 DIAGNOSIS — R5383 Other fatigue: Secondary | ICD-10-CM | POA: Insufficient documentation

## 2017-03-31 DIAGNOSIS — H8110 Benign paroxysmal vertigo, unspecified ear: Secondary | ICD-10-CM | POA: Insufficient documentation

## 2017-10-11 DIAGNOSIS — E538 Deficiency of other specified B group vitamins: Secondary | ICD-10-CM | POA: Insufficient documentation

## 2017-10-11 DIAGNOSIS — E560 Deficiency of vitamin E: Secondary | ICD-10-CM | POA: Insufficient documentation

## 2017-10-17 DIAGNOSIS — M797 Fibromyalgia: Secondary | ICD-10-CM | POA: Insufficient documentation

## 2018-02-17 DIAGNOSIS — G8929 Other chronic pain: Secondary | ICD-10-CM | POA: Insufficient documentation

## 2018-02-17 DIAGNOSIS — M545 Low back pain, unspecified: Secondary | ICD-10-CM | POA: Insufficient documentation

## 2018-07-26 DIAGNOSIS — J301 Allergic rhinitis due to pollen: Secondary | ICD-10-CM | POA: Insufficient documentation

## 2019-04-23 DIAGNOSIS — R232 Flushing: Secondary | ICD-10-CM | POA: Insufficient documentation

## 2019-04-23 DIAGNOSIS — K5901 Slow transit constipation: Secondary | ICD-10-CM | POA: Insufficient documentation

## 2019-04-23 DIAGNOSIS — T781XXA Other adverse food reactions, not elsewhere classified, initial encounter: Secondary | ICD-10-CM | POA: Insufficient documentation

## 2019-04-23 DIAGNOSIS — Z566 Other physical and mental strain related to work: Secondary | ICD-10-CM | POA: Insufficient documentation

## 2019-06-28 DIAGNOSIS — M2012 Hallux valgus (acquired), left foot: Secondary | ICD-10-CM | POA: Insufficient documentation

## 2019-12-07 DIAGNOSIS — G5701 Lesion of sciatic nerve, right lower limb: Secondary | ICD-10-CM | POA: Insufficient documentation

## 2020-02-27 NOTE — Progress Notes (Signed)
Subjective:  Patient ID: Leslie Bray, female    DOB: 04-12-1976  Age: 44 y.o. MRN: [id number]  CC:  Chief Complaint  Patient presents with  . Establish Care    HPI Leslie Bray presents to establish care.  She has a long history of widespread body pain.  Notes that she is always in pain from her neck all the way to her toes.  Over the last 5 years she has had significant leg pain that extends from hips down to her feet.  In 2008 she had an accident but this caused a worsening of the pain on the right side of her body.  She has been seen by her previous primary care, rheumatology, and neurology.  She has also been evaluated by a chiropractor and physical therapy.  A full work-up has been completed and they have been unable to find any diagnosis to explain her symptoms.  At one point she was told she had fibromyalgia but a recent provider did some testing and found vitamin deficiency.  That provider told her she did not have fibromyalgia and the vitamin deficiency was cause of her symptoms.  She is now taking vitamin supplements but her pain is still present.  She has tried Lyrica in the past but did not tolerate this due to cognitive effects.  She was prescribed Cymbalta but got scared and did not take this medication.  Symptoms are worse with bad weather and cold temperatures.  She has not found any measures that make her symptoms better.  She has tried massage, dry needling, rolling on foam roller/ball.  She uses Flexeril 10 mg 3 times daily as needed when her pain is severe but has not taken this in a couple of months because she ran out.  She was seeing pain management prior to her job change and would like a referral to get in contact with a new pain management provider.  History of iron deficiency anemia.  Was followed by hematology previously and would like a referral to get in touch with a new hematology provider.  History of vertigo exacerbated by allergies.  She does  have episodes every 1-2 months of vertigo where she experiences significant dizziness accompanied by nausea.  She does not have any medication she uses for these intermittent episodes but would like something so that she is not down and out for couple of days with her symptoms.  She does take Singulair 10 mg daily in addition to a daily Claritin to help manage her symptoms.  Past Medical History:  Diagnosis Date  . Active labor at term 04/03/2014  . Allergy    seasonal  . Anemia   . Fibromyalgia   . Gallbladder and bile duct calculus with cholecystitis with obstruction 08/2011  . Gastric bypass status for obesity 04/2010  . Hx of LASIK 04/2002  . Hx of LASIK 10/2008  . Low blood sugar   . Low blood sugar   . Refusal of blood transfusions as patient is Jehovah's Witness 09/10/11  . UTI (urinary tract infection)   . Vitamin D deficiency     Past Surgical History:  Procedure Laterality Date  . CHOLECYSTECTOMY  09/10/11  . CHOLECYSTECTOMY  09/10/2011   Procedure: LAPAROSCOPIC CHOLECYSTECTOMY WITH INTRAOPERATIVE CHOLANGIOGRAM;  Surgeon: Atilano Ina, MD,FACS;  Location: [facility];  Service: General;  Laterality: N/A;  . ESOPHAGOGASTRODUODENOSCOPY  11/09/2011   Procedure: ESOPHAGOGASTRODUODENOSCOPY (EGD);  Surgeon: Atilano Ina, MD,FACS;  Location: [facility];  Service: General;  Laterality: N/A;  . LAPAROSCOPIC GASTRIC BYPASS  05/09/10   [place]  . LASIK  2004, 2010   bilateral    Family History  Problem Relation Age of Onset  . Hyperlipidemia Mother     Social History   Socioeconomic History  . Marital status: Married    Spouse name: Not on file  . Number of children: Not on file  . Years of education: Not on file  . Highest education level: Not on file  Occupational History  . Not on file  Tobacco Use  . Smoking status: Never Smoker  . Smokeless tobacco: Never Used  Vaping Use  . Vaping Use: Never used  Substance and Sexual Activity  . Alcohol use: Yes    Comment: Rarely  .  Drug use: No  . Sexual activity: Yes    Birth control/protection: Coitus interruptus, Pill  Other Topics Concern  . Not on file  Social History Narrative  . Not on file   Social Determinants of Health   Financial Resource Strain:   . Difficulty of Paying Living Expenses: Not on file  Food Insecurity:   . Worried About Programme researcher, broadcasting/film/video in the Last Year: Not on file  . Ran Out of Food in the Last Year: Not on file  Transportation Needs:   . Lack of Transportation (Medical): Not on file  . Lack of Transportation (Non-Medical): Not on file  Physical Activity:   . Days of Exercise per Week: Not on file  . Minutes of Exercise per Session: Not on file  Stress:   . Feeling of Stress : Not on file  Social Connections:   . Frequency of Communication with Friends and Family: Not on file  . Frequency of Social Gatherings with Friends and Family: Not on file  . Attends Religious Services: Not on file  . Active Member of Clubs or Organizations: Not on file  . Attends Banker Meetings: Not on file  . Marital Status: Not on file  Intimate Partner Violence:   . Fear of Current or Ex-Partner: Not on file  . Emotionally Abused: Not on file  . Physically Abused: Not on file  . Sexually Abused: Not on file    ROS Review of Systems  Constitutional: Positive for fatigue. Negative for chills, fever and unexpected weight change.  HENT: Positive for sinus pressure.   Respiratory: Negative for cough, chest tightness, shortness of breath and wheezing.   Cardiovascular: Negative for chest pain, palpitations and leg swelling.  Gastrointestinal: Positive for constipation and nausea. Negative for abdominal pain, diarrhea and vomiting.  Genitourinary: Negative for dysuria, frequency, hematuria and urgency.  Musculoskeletal: Positive for arthralgias, back pain, myalgias and neck pain. Negative for gait problem and joint swelling.  Neurological: Positive for dizziness, weakness (arms with  activities like combing hair/blow drying hair) and numbness. Negative for light-headedness and headaches.  Psychiatric/Behavioral: Negative for dysphoric mood, self-injury, sleep disturbance and suicidal ideas. The patient is not nervous/anxious.     Objective:   Today's Vitals: BP (!) 90/57   Pulse 62   Temp 98 F (36.7 C) (Oral)   Ht 5\' 8"  (1.727 m)   Wt 178 lb 3.2 oz (80.8 kg)   LMP 01/09/2020   SpO2 100%   BMI 27.10 kg/m   Physical Exam Vitals reviewed.  Constitutional:      General: She is not in acute distress.    Appearance: Normal appearance.  HENT:     Head: Normocephalic and atraumatic.  Cardiovascular:     Rate and Rhythm: Normal rate and regular rhythm.     Pulses: Normal pulses.     Heart sounds: Normal heart sounds. No murmur heard.  No friction rub. No gallop.   Pulmonary:     Effort: Pulmonary effort is normal. No respiratory distress.     Breath sounds: Normal breath sounds. No wheezing.  Skin:    General: Skin is warm and dry.  Neurological:     Mental Status: She is alert and oriented to person, place, and time.  Psychiatric:        Mood and Affect: Mood normal.        Behavior: Behavior normal.        Thought Content: Thought content normal.        Judgment: Judgment normal.     Assessment & Plan:   1. Encounter to establish care Reviewed available information and discussed health care concerns with patient.  She is up-to-date on preventative care at this time.  2. Fibromyalgia Fibromyalgia questionnaire completed with a WPI score of 13 and a symptom severity score of 6 indicating that she meets diagnostic criteria for fibromyalgia.  Referring to pain clinic as requested.  Refilling Flexeril 10 mg 3 times daily as needed.  Has not tried gabapentin in the past so starting at 100 mg 3 times daily with taper instructions for a goal of 300 mg 3 times daily. - Ambulatory referral to Pain Clinic  3. Iron deficiency anemia, unspecified iron deficiency  anemia type Referring to hematology as requested. - Ambulatory referral to Hematology  4. Benign paroxysmal positional vertigo due to bilateral vestibular disorder Sending in meclizine 25 mg 3 times daily as needed to use when she has vertigo flares.  5. Seasonal allergic rhinitis due to pollen Refilling Singulair 10 mg nightly.  Continue daily oral antihistamine.  Outpatient Encounter Medications as of 02/28/2020  Medication Sig  . Cyanocobalamin (VITAMIN B-12) 5000 MCG TBDP Take 1 tablet by mouth daily.  . folic acid (FOLVITE) 1 MG tablet Take 1 mg by mouth daily.  Marland Kitchen levonorgestrel-ethinyl estradiol (NORDETTE) 0.15-30 MG-MCG tablet Take 1 tablet by mouth daily.  Marland Kitchen loratadine (CLARITIN) 10 MG tablet Take 1 tablet by mouth daily.  . montelukast (SINGULAIR) 10 MG tablet Take 1 tablet (10 mg total) by mouth daily.  . vitamin E 180 MG (400 UNITS) capsule Take 400 Units by mouth daily.  . [DISCONTINUED] montelukast (SINGULAIR) 10 MG tablet Take 10 mg by mouth daily.  . cyclobenzaprine (FLEXERIL) 10 MG tablet Take 1 tablet (10 mg total) by mouth 3 (three) times daily as needed for muscle spasms.  Marland Kitchen gabapentin (NEURONTIN) 100 MG capsule Take 1 capsule (100 mg total) by mouth 3 (three) times daily for 7 days, THEN 2 capsules (200 mg total) 3 (three) times daily for 7 days, THEN 3 capsules (300 mg total) 3 (three) times daily for 14 days.  . meclizine (ANTIVERT) 25 MG tablet Take 1 tablet (25 mg total) by mouth 3 (three) times daily as needed for dizziness or nausea.   No facility-administered encounter medications on file as of 02/28/2020.    Follow-up: Return for fibromyalgia follow up in 3-4 weeks (okay to be virtual).   Thayer Ohm, DNP, APRN, FNP-BC [facility]

## 2020-02-28 ENCOUNTER — Ambulatory Visit (INDEPENDENT_AMBULATORY_CARE_PROVIDER_SITE_OTHER): Payer: 59 | Admitting: Medical-Surgical

## 2020-02-28 ENCOUNTER — Encounter: Payer: Self-pay | Admitting: Medical-Surgical

## 2020-02-28 ENCOUNTER — Other Ambulatory Visit: Payer: Self-pay

## 2020-02-28 VITALS — BP 90/57 | HR 62 | Temp 98.0°F | Ht 68.0 in | Wt 178.2 lb

## 2020-02-28 DIAGNOSIS — Z7689 Persons encountering health services in other specified circumstances: Secondary | ICD-10-CM | POA: Diagnosis not present

## 2020-02-28 DIAGNOSIS — H8113 Benign paroxysmal vertigo, bilateral: Secondary | ICD-10-CM | POA: Diagnosis not present

## 2020-02-28 DIAGNOSIS — M797 Fibromyalgia: Secondary | ICD-10-CM

## 2020-02-28 DIAGNOSIS — D509 Iron deficiency anemia, unspecified: Secondary | ICD-10-CM | POA: Diagnosis not present

## 2020-02-28 DIAGNOSIS — J301 Allergic rhinitis due to pollen: Secondary | ICD-10-CM

## 2020-02-28 MED ORDER — CYCLOBENZAPRINE HCL 10 MG PO TABS: 10.0000 mg | ORAL_TABLET | Freq: Three times a day (TID) | ORAL | 0 refills | Status: AC | PRN

## 2020-02-28 MED ORDER — MONTELUKAST SODIUM 10 MG PO TABS: 10.0000 mg | ORAL_TABLET | Freq: Every day | ORAL | 3 refills | Status: AC

## 2020-02-28 MED ORDER — MECLIZINE HCL 25 MG PO TABS: 25.0000 mg | ORAL_TABLET | Freq: Three times a day (TID) | ORAL | 3 refills | Status: AC | PRN

## 2020-02-28 MED ORDER — GABAPENTIN 100 MG PO CAPS: ORAL_CAPSULE | ORAL | 0 refills | Status: DC

## 2020-02-29 ENCOUNTER — Encounter: Payer: Self-pay | Admitting: Physical Medicine and Rehabilitation

## 2020-03-05 ENCOUNTER — Telehealth: Payer: Self-pay

## 2020-03-05 NOTE — Telephone Encounter (Signed)
Pt aware of new pt appt set for 03/12/20.  Pt states that she does not need a sch or map mailed to her...Marland KitchenMarland KitchenMarland Kitchen AOM

## 2020-03-11 ENCOUNTER — Other Ambulatory Visit: Payer: Self-pay | Admitting: Family

## 2020-03-11 DIAGNOSIS — D649 Anemia, unspecified: Secondary | ICD-10-CM

## 2020-03-12 ENCOUNTER — Inpatient Hospital Stay: Payer: 59 | Attending: Family

## 2020-03-12 ENCOUNTER — Inpatient Hospital Stay (HOSPITAL_BASED_OUTPATIENT_CLINIC_OR_DEPARTMENT_OTHER): Payer: 59 | Admitting: Family

## 2020-03-12 ENCOUNTER — Telehealth: Payer: Self-pay

## 2020-03-12 ENCOUNTER — Encounter: Payer: Self-pay | Admitting: Family

## 2020-03-12 ENCOUNTER — Other Ambulatory Visit: Payer: Self-pay

## 2020-03-12 VITALS — BP 99/51 | HR 57 | Temp 98.2°F | Resp 18 | Ht 68.0 in | Wt 173.1 lb

## 2020-03-12 DIAGNOSIS — D649 Anemia, unspecified: Secondary | ICD-10-CM

## 2020-03-12 DIAGNOSIS — Z9884 Bariatric surgery status: Secondary | ICD-10-CM | POA: Diagnosis not present

## 2020-03-12 DIAGNOSIS — Z79899 Other long term (current) drug therapy: Secondary | ICD-10-CM | POA: Insufficient documentation

## 2020-03-12 DIAGNOSIS — D509 Iron deficiency anemia, unspecified: Secondary | ICD-10-CM | POA: Diagnosis present

## 2020-03-12 DIAGNOSIS — K909 Intestinal malabsorption, unspecified: Secondary | ICD-10-CM | POA: Diagnosis not present

## 2020-03-12 DIAGNOSIS — D508 Other iron deficiency anemias: Secondary | ICD-10-CM | POA: Diagnosis not present

## 2020-03-12 LAB — CBC WITH DIFFERENTIAL (CANCER CENTER ONLY)
Abs Immature Granulocytes: 0.06 10*3/uL (ref 0.00–0.07)
Basophils Absolute: 0 10*3/uL (ref 0.0–0.1)
Basophils Relative: 1 %
Eosinophils Absolute: 0.1 10*3/uL (ref 0.0–0.5)
Eosinophils Relative: 1 %
HCT: 41.5 % (ref 36.0–46.0)
Hemoglobin: 13.7 g/dL (ref 12.0–15.0)
Immature Granulocytes: 1 %
Lymphocytes Relative: 25 %
Lymphs Abs: 1.6 10*3/uL (ref 0.7–4.0)
MCH: 30.4 pg (ref 26.0–34.0)
MCHC: 33 g/dL (ref 30.0–36.0)
MCV: 92.2 fL (ref 80.0–100.0)
Monocytes Absolute: 0.5 10*3/uL (ref 0.1–1.0)
Monocytes Relative: 7 %
Neutro Abs: 4.2 10*3/uL (ref 1.7–7.7)
Neutrophils Relative %: 65 %
Platelet Count: 261 10*3/uL (ref 150–400)
RBC: 4.5 MIL/uL (ref 3.87–5.11)
RDW: 12.7 % (ref 11.5–15.5)
WBC Count: 6.4 10*3/uL (ref 4.0–10.5)
nRBC: 0 % (ref 0.0–0.2)

## 2020-03-12 LAB — CMP (CANCER CENTER ONLY)
ALT: 105 U/L — ABNORMAL HIGH (ref 0–44)
AST: 70 U/L — ABNORMAL HIGH (ref 15–41)
Albumin: 4 g/dL (ref 3.5–5.0)
Alkaline Phosphatase: 36 U/L — ABNORMAL LOW (ref 38–126)
Anion gap: 5 (ref 5–15)
BUN: 15 mg/dL (ref 6–20)
CO2: 26 mmol/L (ref 22–32)
Calcium: 9.1 mg/dL (ref 8.9–10.3)
Chloride: 107 mmol/L (ref 98–111)
Creatinine: 0.69 mg/dL (ref 0.44–1.00)
GFR, Estimated: >60 mL/min (ref >60)
Glucose, Bld: 82 mg/dL (ref 70–99)
Potassium: 3.9 mmol/L (ref 3.5–5.1)
Sodium: 138 mmol/L (ref 135–145)
Total Bilirubin: 0.4 mg/dL (ref 0.3–1.2)
Total Protein: 6.5 g/dL (ref 6.5–8.1)

## 2020-03-12 LAB — RETICULOCYTES
Immature Retic Fract: 4.6 % (ref 2.3–15.9)
RBC.: 4.44 MIL/uL (ref 3.87–5.11)
Retic Count, Absolute: 74.6 10*3/uL (ref 19.0–186.0)
Retic Ct Pct: 1.7 % (ref 0.4–3.1)

## 2020-03-12 LAB — SAVE SMEAR(SSMR), FOR PROVIDER SLIDE REVIEW

## 2020-03-12 LAB — IRON AND TIBC
Iron: 172 ug/dL — ABNORMAL HIGH (ref 41–142)
Saturation Ratios: 40 % (ref 21–57)
TIBC: 425 ug/dL (ref 236–444)
UIBC: 253 ug/dL (ref 120–384)

## 2020-03-12 LAB — LACTATE DEHYDROGENASE: LDH: 206 U/L — ABNORMAL HIGH (ref 98–192)

## 2020-03-12 LAB — FERRITIN: Ferritin: 286 ng/mL (ref 11–307)

## 2020-03-12 NOTE — Progress Notes (Signed)
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 She had gastric bypass surgery in 2012 and anemia was first noted with pregnancy in 2014.  She is not a candidate for oral iron and has received IV iron multiple times in the past. Her most recent infusion was in February 2021.  She states that she is doing well right now and just wanted to establish care as she has gotten new insurance.  She states that when her iron is low she becomes fatigued weak, dizzy, SOB with any exertion, has restless legs and heart palpitations.  Her mother has also had gastric bypass and has history of anemia.  She is on oral contraception and only has a cycle every 4 months or so and only occasional spotting.  No other blood loss noted. No abnormal bruising, no petechiae.  She has 2 little girls and no history of miscarriage.  She has had issues with her hair thinning.  No fever, chills, n/v, cough, rash, dizziness, SOB, chest pain, palpitations, abdominal pain or changes in bowel or bladder habits.  She has history of constipation.  No diabetes or thyroid disease.  No swelling, tenderness, numbness or tingling. No falls or syncope.  She has maintained a good appetite and is doing her best to stay well hydrated. Her weight is described as stable.  She does not smoke or use recreational drugs. She has the occasional alcoholic beverage socially.  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 She has also worked as an Engineer, technical sales in [employer], [employer] in the past.   ROS: All other 10 point review of systems is negative.   PAST MEDICAL HISTORY:   Past Medical History:  Diagnosis Date  . Active labor at term 04/03/2014  . Allergy    seasonal  . Anemia   . Fibromyalgia   . Gallbladder and bile duct calculus with cholecystitis with obstruction 08/2011  . Gastric bypass status for obesity 04/2010  . Hx of LASIK 04/2002  . Hx of LASIK 10/2008  . Low blood sugar   . Low blood sugar   . Refusal of blood transfusions as patient is Jehovah's Witness 09/10/11  . UTI (urinary tract infection)   . Vitamin D deficiency     ALLERGIES: No Known Allergies    MEDICATIONS:  Current Outpatient Medications on File Prior to Visit  Medication Sig Dispense Refill  . Cyanocobalamin (VITAMIN B-12) 5000 MCG TBDP Take 1 tablet by mouth daily.    . cyclobenzaprine (FLEXERIL) 10 MG tablet Take 1 tablet (10 mg total) by mouth 3 (three) times daily as needed for muscle spasms. 30 tablet 0  . folic acid (FOLVITE) 1 MG tablet Take 1 mg by mouth daily.    Marland Kitchen gabapentin (NEURONTIN) 100 MG capsule Take 1 capsule (100 mg total) by mouth 3 (three) times daily for 7 days, THEN 2 capsules (200 mg total) 3 (three) times daily for 7 days, THEN 3 capsules (300 mg total) 3 (three) times daily for 14 days. 189  capsule 0  . levonorgestrel-ethinyl estradiol (NORDETTE) 0.15-30 MG-MCG tablet Take 1 tablet by mouth daily.    Marland Kitchen loratadine (CLARITIN) 10 MG tablet Take 1 tablet by mouth daily.    . meclizine (ANTIVERT) 25 MG tablet Take 1 tablet (25 mg total) by mouth 3 (three) times daily as needed for dizziness or nausea. 30 tablet 3  . montelukast (SINGULAIR) 10 MG tablet Take 1 tablet (10 mg total) by mouth daily. 90 tablet 3  . vitamin E 180 MG (400 UNITS) capsule Take 400 Units by mouth daily.     No current facility-administered medications on file prior to visit.     PAST SURGICAL HISTORY Past Surgical History:  Procedure Laterality Date  . CHOLECYSTECTOMY   09/10/11  . CHOLECYSTECTOMY  09/10/2011   Procedure: LAPAROSCOPIC CHOLECYSTECTOMY WITH INTRAOPERATIVE CHOLANGIOGRAM;  Surgeon: Atilano Ina, MD,FACS;  Location: [facility] OR;  Service: General;  Laterality: N/A;  . ESOPHAGOGASTRODUODENOSCOPY  11/09/2011   Procedure: ESOPHAGOGASTRODUODENOSCOPY (EGD);  Surgeon: Atilano Ina, MD,FACS;  Location: [facility];  Service: General;  Laterality: N/A;  . LAPAROSCOPIC GASTRIC BYPASS  05/09/10   [place]  . LASIK  2004, 2010   bilateral    FAMILY HISTORY: Family History  Problem Relation Age of Onset  . Hyperlipidemia Mother     SOCIAL HISTORY:  reports that she has never smoked. She has never used smokeless tobacco. She reports current alcohol use. She reports that she does not use drugs.  PERFORMANCE STATUS: The patient's performance status is 0 - Asymptomatic  PHYSICAL EXAM: Most Recent Vital Signs: There were no vitals taken for this visit. There were no vitals taken for this visit.  General Appearance:    Alert, cooperative, no distress, appears stated age  Head:    Normocephalic, without obvious abnormality, atraumatic  Eyes:    PERRL, conjunctiva/corneas clear, EOM's intact, fundi    benign, both eyes        Throat:   Lips, mucosa, and tongue normal; teeth and gums normal  Neck:   Supple, symmetrical, trachea midline, no adenopathy;    thyroid:  no enlargement/tenderness/nodules; no carotid   bruit or JVD  Back:     Symmetric, no curvature, ROM normal, no CVA tenderness  Lungs:     Clear to auscultation bilaterally, respirations unlabored  Chest Wall:    No tenderness or deformity   Heart:    Regular rate and rhythm, S1 and S2 normal, no murmur, rub   or gallop     Abdomen:     Soft, non-tender, bowel sounds active all four quadrants,    no masses, no organomegaly        Extremities:   Extremities normal, atraumatic, no cyanosis or edema  Pulses:   2+ and symmetric all extremities  Skin:   Skin color, texture, turgor normal, no  rashes or lesions  Lymph nodes:   Cervical, supraclavicular, and axillary nodes normal  Neurologic:   CNII-XII intact, normal strength, sensation and reflexes    throughout    LABORATORY DATA:  No results found for this or any previous visit (from the past 48 hour(s)).    RADIOGRAPHY: No results found.     PATHOLOGY: None   ASSESSMENT/PLAN: Leslie Bray is a very pleasant 44 yo Hispanic female with history of iron deficiency anemia post gastric bypass in 2012. She is doing well at this time and wanted to go ahead and establish care with our office for any future needs.  We will see what her  lab work looks like and set up infusions if needed.   We will go ahead and plan to follow-up in 6 months.   All questions were answered and she is in agreement with the plan. She was encouraged to contact our office with any questions or concerns. We can certainly see her sooner if needed.    Emeline Gins, NP

## 2020-03-12 NOTE — Telephone Encounter (Signed)
appts made for pt per 03/12/20 los, pt req not to print.... AOM

## 2020-03-13 LAB — ERYTHROPOIETIN: Erythropoietin: 4.8 m[IU]/mL (ref 2.6–18.5)

## 2020-03-17 LAB — HGB FRACTIONATION CASCADE
Hgb A2: 2.6 % (ref 1.8–3.2)
Hgb A: 97.4 % (ref 96.4–98.8)
Hgb F: 0 % (ref 0.0–2.0)
Hgb S: 0 %

## 2020-03-19 ENCOUNTER — Telehealth: Payer: Self-pay | Admitting: *Deleted

## 2020-03-19 ENCOUNTER — Telehealth: Payer: Self-pay

## 2020-03-19 NOTE — Telephone Encounter (Signed)
Pt notified per order of S. [place] NP that "Labs are stable!  No intervention needed at this time.  We will see her in 6 months for follow-up!"  Pt appreciative of call and has no questions at this time.

## 2020-03-19 NOTE — Telephone Encounter (Signed)
Called and left message for patient to call back to go over lab results.  

## 2020-03-19 NOTE — Telephone Encounter (Signed)
-----   Message from Verdie Mosher, NP sent at 03/19/2020  2:53 PM EST ----- Labs are stable! No intervention needed at this time. We will see her in 6 months for follow-up!  Sarah ----- Message ----- From: Leory Plowman, Lab In [place] Sent: 03/12/2020   8:43 AM EST To: Verdie Mosher, NP

## 2020-03-19 NOTE — Telephone Encounter (Signed)
-----   Message from Sarah M [place], NP sent at 03/19/2020  2:53 PM EST ----- Labs are stable! No intervention needed at this time. We will see her in 6 months for follow-up!  Sarah ----- Message ----- From: Interface, Lab In [place] Sent: 03/12/2020   8:43 AM EST To: Sarah M [place], NP   

## 2020-03-28 ENCOUNTER — Encounter: Payer: Self-pay | Admitting: Medical-Surgical

## 2020-03-28 ENCOUNTER — Telehealth (INDEPENDENT_AMBULATORY_CARE_PROVIDER_SITE_OTHER): Payer: 59 | Admitting: Medical-Surgical

## 2020-03-28 DIAGNOSIS — R748 Abnormal levels of other serum enzymes: Secondary | ICD-10-CM | POA: Diagnosis not present

## 2020-03-28 DIAGNOSIS — R232 Flushing: Secondary | ICD-10-CM

## 2020-03-28 DIAGNOSIS — M797 Fibromyalgia: Secondary | ICD-10-CM

## 2020-03-28 MED ORDER — GABAPENTIN 300 MG PO CAPS: 300.0000 mg | ORAL_CAPSULE | Freq: Three times a day (TID) | ORAL | 1 refills | Status: DC

## 2020-03-28 NOTE — Progress Notes (Signed)
Virtual Visit via Video Note  I connected with Leslie Bray on 03/28/20 at  9:10 AM EST by a video enabled telemedicine application and verified that I am speaking with the correct person using two identifiers.   I discussed the limitations of evaluation and management by telemedicine and the availability of in person appointments. The patient expressed understanding and agreed to proceed.  Patient location: home Provider locations: office  Subjective:    CC: Fibromyalgia follow-up  HPI: Pleasant 44 year old female presenting today for fibromyalgia follow-up.  Approximately 4 weeks ago, she was started on gabapentin to help manage her widespread body pains.  She is up to 300 mg 3 times daily, tolerating well without side effects.  Notes that the gabapentin has helped tremendously with her body aches and pains.  She does still have low back issues with sciatic pain.  Today is specifically a bad day for her sciatica but she this is not happening as frequently as it used to.  A surprising benefit is that her hot flashes have resolved completely.  She has an upcoming pain clinic appointment in January.  In the recent oncology appointment, her labs were checked where she was found to have significant elevations of her liver enzymes.  She does have a history of mildly elevated ALT/AST but her most recent readings were even higher.  I received a note from her oncology provider who advised rechecking in 2 to 3 weeks.  She is currently asymptomatic.  Past medical history, Surgical history, Family history not pertinant except as noted below, Social history, Allergies, and medications have been entered into the medical record, reviewed, and corrections made.   Review of Systems: See HPI for pertinent positives and negatives.   Objective:    General: Speaking clearly in complete sentences without any shortness of breath.  Alert and oriented x3.  Normal judgment. No apparent acute  distress.  Impression and Recommendations:    1. Elevated liver enzymes Discussed potential causes of elevations in liver enzymes.  She does have mild elevations at baseline but there is concern for the need elevation given that we recently started gabapentin.  We will go ahead and put labs in to have her enzymes rechecked and include other labs as below. - Gamma GT - CP4508-PT/INR AND PTT - TSH - Lactate dehydrogenase - Lipid panel  2. Fibromyalgia Continue gabapentin 300 mg 3 times daily for now.  She does seem to have a little difficulty sleeping on some nights so we may need to adjust her bedtime dose but we will need to wait on her lab evaluation for liver enzymes before making any changes.  If her enzymes have worsened or are not improved, we may need to reevaluate the use of gabapentin since this seems to be the only thing that is changed recently.  3. Hot flashes Continue gabapentin since this seems to be helping.  I discussed the assessment and treatment plan with the patient. The patient was provided an opportunity to ask questions and all were answered. The patient agreed with the plan and demonstrated an understanding of the instructions.   The patient was advised to call back or seek an in-person evaluation if the symptoms worsen or if the condition fails to improve as anticipated.  Return in about 3 months (around 06/26/2020) for General follow-up.  Thayer Ohm, DNP, APRN, FNP-BC  [facility]

## 2020-04-25 ENCOUNTER — Encounter: Payer: 59 | Admitting: Physical Medicine and Rehabilitation

## 2020-05-18 ENCOUNTER — Ambulatory Visit: Payer: Self-pay

## 2020-06-10 ENCOUNTER — Encounter: Payer: 59 | Attending: Physical Medicine and Rehabilitation | Admitting: Physical Medicine and Rehabilitation

## 2020-06-10 ENCOUNTER — Other Ambulatory Visit: Payer: Self-pay

## 2020-06-10 ENCOUNTER — Encounter: Payer: Self-pay | Admitting: Physical Medicine and Rehabilitation

## 2020-06-10 VITALS — BP 108/72 | HR 71 | Temp 98.8°F | Ht 67.0 in | Wt 178.2 lb

## 2020-06-10 DIAGNOSIS — G5701 Lesion of sciatic nerve, right lower limb: Secondary | ICD-10-CM | POA: Diagnosis present

## 2020-06-10 DIAGNOSIS — M5431 Sciatica, right side: Secondary | ICD-10-CM | POA: Diagnosis present

## 2020-06-10 DIAGNOSIS — M5412 Radiculopathy, cervical region: Secondary | ICD-10-CM

## 2020-06-10 MED ORDER — DICLOFENAC SODIUM 75 MG PO TBEC: 75.0000 mg | DELAYED_RELEASE_TABLET | Freq: Two times a day (BID) | ORAL | 3 refills | Status: AC | PRN

## 2020-06-10 NOTE — Patient Instructions (Signed)
-  Discussed following foods that may reduce pain: 1) Ginger 2) Blueberries 3) Salmon 4) Pumpkin seeds 5) dark chocolate 6) turmeric 7) tart cherries 8) virgin olive oil 9) chilli peppers 10) mint 11) red wine 12) garlic  Link to further information on diet for chronic pain: https://[email or web address].com/treatments/complementary/diet-patients-chronic-pain   Turmeric to reduce inflammation-can be used in cooking or taken as a supplement.  Benefits of turmeric:  -Highly anti-inflammatory  -Increases antioxidants  -Improves memory, attention, brain disease  -Lowers risk of heart disease  -May help prevent cancer  -Decreases pain  -Alleviates depression  -Delays aging and decreases risk of chronic disease  -Consume with black pepper to increase absorption    Turmeric Milk Recipe:  1 cup milk  1 tsp turmeric  1 tsp cinnamon  1 tsp grated ginger (optional)  Black pepper (boosts the anti-inflammatory properties of turmeric).  1 tsp honey 
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
## 2020-06-25 ENCOUNTER — Telehealth (INDEPENDENT_AMBULATORY_CARE_PROVIDER_SITE_OTHER): Payer: 59 | Admitting: Medical-Surgical

## 2020-06-25 ENCOUNTER — Encounter: Payer: Self-pay | Admitting: Medical-Surgical

## 2020-06-25 DIAGNOSIS — R3989 Other symptoms and signs involving the genitourinary system: Secondary | ICD-10-CM

## 2020-06-25 NOTE — Progress Notes (Signed)
Virtual Visit via Video Note  I connected with Leslie Bray on 06/25/20 at  1:20 PM EST by a video enabled telemedicine application and verified that I am speaking with the correct person using two identifiers.   I discussed the limitations of evaluation and management by telemedicine and the availability of in person appointments. The patient expressed understanding and agreed to proceed.  Patient location: home Provider locations: office  Subjective:    CC: bladder/pelvic pain  HPI: Pleasant 45 year old female presenting via MyChart video visit with reports of recurrent bladder pain over the last 2 months. She has had this problem in the past and was evaluated by Urology and OB/GYN with no findings to explain her symptoms. Reports her symptoms are intermittent and include bladder pain/pressure, leg pain, pelvic pain, and low back pain. She has had multiple UAs but there were always negative for infection. When she started Gabapentin, she noted an improvement in her symptoms but they have since returned. Notes that this past Friday and Monday, her pain was severe. Today, she is pain free.   Past medical history, Surgical history, Family history not pertinant except as noted below, Social history, Allergies, and medications have been entered into the medical record, reviewed, and corrections made.   Review of Systems: See HPI for pertinent positives and negatives.   Objective:    General: Speaking clearly in complete sentences without any shortness of breath.  Alert and oriented x3.  Normal judgment. No apparent acute distress.  Impression and Recommendations:    1. Bladder pain Unclear etiology. No symptoms today so no acute need to intervene. Ultimately, she would do well to be re-evaluated by Urology. Referral entered. Advised patient to contact us should she have a recurrence of symptoms, to contact our office and let us know since we can likely get her in for a urinalysis  and culture.   I discussed the assessment and treatment plan with the patient. The patient was provided an opportunity to ask questions and all were answered. The patient agreed with the plan and demonstrated an understanding of the instructions.   The patient was advised to call back or seek an in-person evaluation if the symptoms worsen or if the condition fails to improve as anticipated.  20 minutes of non-face-to-face time was provided during this encounter.  Return if symptoms worsen or fail to improve.  Thayer Ohm, DNP, APRN, FNP-BC [facility]

## 2020-07-11 ENCOUNTER — Telehealth: Payer: Self-pay

## 2020-07-11 NOTE — Telephone Encounter (Signed)
Called pt and r/s her appt as Leslie Bray will not be in the office on thurs    anne

## 2020-07-16 ENCOUNTER — Encounter: Payer: Self-pay | Admitting: Medical-Surgical

## 2020-07-29 ENCOUNTER — Telehealth: Payer: Self-pay

## 2020-07-29 ENCOUNTER — Other Ambulatory Visit: Payer: Self-pay

## 2020-07-29 MED ORDER — GABAPENTIN 300 MG PO CAPS: 300.0000 mg | ORAL_CAPSULE | Freq: Three times a day (TID) | ORAL | 1 refills | Status: AC

## 2020-07-29 NOTE — Telephone Encounter (Signed)
Pt called in to r/s her appt to 10/03/20    Leslie Bray

## 2020-08-04 ENCOUNTER — Other Ambulatory Visit: Payer: Self-pay

## 2020-08-04 ENCOUNTER — Encounter: Payer: Self-pay | Admitting: Physical Medicine and Rehabilitation

## 2020-08-04 ENCOUNTER — Encounter: Payer: 59 | Attending: Physical Medicine and Rehabilitation | Admitting: Physical Medicine and Rehabilitation

## 2020-08-04 VITALS — BP 104/66 | HR 67 | Temp 99.0°F | Ht 67.0 in | Wt 177.2 lb

## 2020-08-04 DIAGNOSIS — G5701 Lesion of sciatic nerve, right lower limb: Secondary | ICD-10-CM | POA: Diagnosis present

## 2020-08-04 NOTE — Progress Notes (Signed)
Trigger Point Injection w/ ultrasound guidance  Indication: Pyriformis syndrome not relieved by medication management and other conservative care.  Informed consent was obtained after describing risk and benefits of the procedure with the patient, this includes bleeding, bruising, infection and medication side effects.  The patient wishes to proceed and has given written consent.  The patient was placed ina prone position.  The pyriformis muscle was visualized under ultrasound guidance. It was entered with a 25-gauge 1/2 inch needle and a total of 3 mL of 1% lidocaine and 1 mL of celestone was injected into the pyriformis muscle after negative draw back for blood.  The patient tolerated the procedure well.  Post procedure instructions were given.

## 2020-08-04 NOTE — Patient Instructions (Addendum)
1) Ginger (especially studied for arthritis)- reduce leukotriene production to decrease inflammation 2) Blueberries- high in phytonutrients that decrease inflammation 3) Salmon- marine omega-3s reduce joint swelling and pain 4) Pumpkin seeds- reduce inflammation 5) dark chocolate- reduces inflammation 6) turmeric- reduces inflammation 7) tart cherries - reduce pain and stiffness 8) extra virgin olive oil - its compound olecanthal helps to block prostaglandins  9) chili peppers- can be eaten or applied topically via capsaicin 10) mint- helpful for headache, muscle aches, joint pain, and itching 11) garlic- reduces inflammation  Link to further information on diet for chronic pain: [email or web address]  

## 2020-09-25 ENCOUNTER — Other Ambulatory Visit: Payer: 59

## 2020-09-25 ENCOUNTER — Ambulatory Visit: Payer: 59 | Admitting: Family

## 2020-09-26 ENCOUNTER — Ambulatory Visit: Payer: 59 | Admitting: Family

## 2020-09-26 ENCOUNTER — Other Ambulatory Visit: Payer: 59

## 2020-10-03 ENCOUNTER — Inpatient Hospital Stay: Payer: 59 | Attending: Medical-Surgical

## 2020-10-03 ENCOUNTER — Inpatient Hospital Stay: Payer: 59 | Admitting: Family

## 2020-10-08 ENCOUNTER — Ambulatory Visit: Payer: 59 | Admitting: Physical Medicine and Rehabilitation

## 2020-10-14 ENCOUNTER — Encounter: Payer: 59 | Attending: Physical Medicine and Rehabilitation | Admitting: Physical Medicine and Rehabilitation

## 2020-10-14 DIAGNOSIS — G5701 Lesion of sciatic nerve, right lower limb: Secondary | ICD-10-CM | POA: Insufficient documentation

## 2021-04-14 ENCOUNTER — Other Ambulatory Visit (HOSPITAL_BASED_OUTPATIENT_CLINIC_OR_DEPARTMENT_OTHER): Payer: Self-pay

## 2021-04-14 MED ORDER — SULFAMETHOXAZOLE-TRIMETHOPRIM 800-160 MG PO TABS
1.0000 | ORAL_TABLET | Freq: Two times a day (BID) | ORAL | 0 refills | Status: DC
  Filled 2021-04-14: qty 6, 3d supply, fill #0
